# Patient Record
Sex: Male | Born: 1990
Health system: Southern US, Community
[De-identification: ages and names within clinical notes are randomized; demographics above are authoritative.]

## PROBLEM LIST (undated history)

## (undated) HISTORY — PX: APPENDECTOMY: SHX54

---

## 2020-05-07 ENCOUNTER — Encounter
Admission: EM | Disposition: A | Discharge status: Home / Self Care | Payer: Self-pay | Source: Home / Self Care | Attending: Emergency Medicine

## 2020-05-07 ENCOUNTER — Observation Stay: Payer: BC Managed Care – PPO | Admitting: Anesthesiology

## 2020-05-07 ENCOUNTER — Emergency Department: Payer: BC Managed Care – PPO

## 2020-05-07 ENCOUNTER — Other Ambulatory Visit: Payer: Self-pay

## 2020-05-07 ENCOUNTER — Observation Stay
Admission: EM | Admit: 2020-05-07 | Discharge: 2020-05-08 | Disposition: A | Discharge status: Home / Self Care | Payer: BC Managed Care – PPO | Attending: Surgery | Admitting: Surgery

## 2020-05-07 ENCOUNTER — Encounter: Payer: Self-pay | Admitting: Emergency Medicine

## 2020-05-07 DIAGNOSIS — K353 Acute appendicitis with localized peritonitis, without perforation or gangrene: Secondary | ICD-10-CM | POA: Diagnosis not present

## 2020-05-07 DIAGNOSIS — Z79899 Other long term (current) drug therapy: Secondary | ICD-10-CM | POA: Diagnosis not present

## 2020-05-07 DIAGNOSIS — K3580 Unspecified acute appendicitis: Principal | ICD-10-CM | POA: Insufficient documentation

## 2020-05-07 DIAGNOSIS — K37 Unspecified appendicitis: Secondary | ICD-10-CM | POA: Diagnosis not present

## 2020-05-07 DIAGNOSIS — Z20822 Contact with and (suspected) exposure to covid-19: Secondary | ICD-10-CM | POA: Insufficient documentation

## 2020-05-07 DIAGNOSIS — R1031 Right lower quadrant pain: Secondary | ICD-10-CM | POA: Diagnosis not present

## 2020-05-07 DIAGNOSIS — K6389 Other specified diseases of intestine: Secondary | ICD-10-CM | POA: Diagnosis not present

## 2020-05-07 HISTORY — PX: XI ROBOTIC LAPAROSCOPIC ASSISTED APPENDECTOMY: SHX6877

## 2020-05-07 LAB — URINALYSIS, COMPLETE (UACMP) WITH MICROSCOPIC
Bacteria, UA: NONE SEEN
Glucose, UA: NEGATIVE mg/dL
Ketones, ur: 5 mg/dL — AB
Leukocytes,Ua: NEGATIVE
Nitrite: NEGATIVE
Protein, ur: 100 mg/dL — AB
Specific Gravity, Urine: >1.046 — ABNORMAL HIGH (ref 1.005–1.030)
pH: 5 (ref 5.0–8.0)

## 2020-05-07 LAB — CBC
HCT: 41 % (ref 39.0–52.0)
Hemoglobin: 14.1 g/dL (ref 13.0–17.0)
MCH: 32.6 pg (ref 26.0–34.0)
MCHC: 34.4 g/dL (ref 30.0–36.0)
MCV: 94.7 fL (ref 80.0–100.0)
Platelets: 284 10*3/uL (ref 150–400)
RBC: 4.33 MIL/uL (ref 4.22–5.81)
RDW: 13.8 % (ref 11.5–15.5)
WBC: 18.8 10*3/uL — ABNORMAL HIGH (ref 4.0–10.5)
nRBC: 0 % (ref 0.0–0.2)

## 2020-05-07 LAB — COMPREHENSIVE METABOLIC PANEL
ALT: 12 U/L (ref 0–44)
AST: 16 U/L (ref 15–41)
Albumin: 4.7 g/dL (ref 3.5–5.0)
Alkaline Phosphatase: 50 U/L (ref 38–126)
Anion gap: 11 (ref 5–15)
BUN: 18 mg/dL (ref 6–20)
CO2: 26 mmol/L (ref 22–32)
Calcium: 9.3 mg/dL (ref 8.9–10.3)
Chloride: 104 mmol/L (ref 98–111)
Creatinine, Ser: 0.95 mg/dL (ref 0.61–1.24)
GFR calc Af Amer: >60 mL/min (ref >60)
GFR calc non Af Amer: >60 mL/min (ref >60)
Glucose, Bld: 129 mg/dL — ABNORMAL HIGH (ref 70–99)
Potassium: 3.3 mmol/L — ABNORMAL LOW (ref 3.5–5.1)
Sodium: 141 mmol/L (ref 135–145)
Total Bilirubin: 0.8 mg/dL (ref 0.3–1.2)
Total Protein: 7.4 g/dL (ref 6.5–8.1)

## 2020-05-07 LAB — SARS CORONAVIRUS 2 BY RT PCR (HOSPITAL ORDER, PERFORMED IN ~~LOC~~ HOSPITAL LAB): SARS Coronavirus 2: NEGATIVE

## 2020-05-07 SURGERY — APPENDECTOMY, ROBOT-ASSISTED, LAPAROSCOPIC
Anesthesia: General

## 2020-05-07 MED ORDER — KETOROLAC TROMETHAMINE 30 MG/ML IJ SOLN
INTRAMUSCULAR | Status: AC
  Filled 2020-05-07: qty 1

## 2020-05-07 MED ORDER — ONDANSETRON HCL 4 MG/2ML IJ SOLN
INTRAMUSCULAR | Status: AC
  Filled 2020-05-07: qty 2

## 2020-05-07 MED ORDER — PROPOFOL 10 MG/ML IV BOLUS
INTRAVENOUS | Status: DC | PRN
  Administered 2020-05-07: 200 mg via INTRAVENOUS

## 2020-05-07 MED ORDER — MIDAZOLAM HCL 2 MG/2ML IJ SOLN
INTRAMUSCULAR | Status: AC
  Filled 2020-05-07: qty 2

## 2020-05-07 MED ORDER — ROCURONIUM BROMIDE 100 MG/10ML IV SOLN
INTRAVENOUS | Status: DC | PRN
  Administered 2020-05-07: 50 mg via INTRAVENOUS

## 2020-05-07 MED ORDER — PHENYLEPHRINE HCL (PRESSORS) 10 MG/ML IV SOLN
INTRAVENOUS | Status: DC | PRN
  Administered 2020-05-07 (×2): 100 ug via INTRAVENOUS

## 2020-05-07 MED ORDER — METRONIDAZOLE IN NACL 5-0.79 MG/ML-% IV SOLN
500.0000 mg | Freq: Once | INTRAVENOUS | Status: AC
  Administered 2020-05-07: 500 mg via INTRAVENOUS
  Filled 2020-05-07: qty 100

## 2020-05-07 MED ORDER — KETOROLAC TROMETHAMINE 30 MG/ML IJ SOLN
INTRAMUSCULAR | Status: DC | PRN
  Administered 2020-05-07: 30 mg via INTRAVENOUS

## 2020-05-07 MED ORDER — PROPOFOL 10 MG/ML IV BOLUS
INTRAVENOUS | Status: AC
  Filled 2020-05-07: qty 20

## 2020-05-07 MED ORDER — MIDAZOLAM HCL 2 MG/2ML IJ SOLN
INTRAMUSCULAR | Status: DC | PRN
  Administered 2020-05-07 (×2): 2 mg via INTRAVENOUS

## 2020-05-07 MED ORDER — POTASSIUM CHLORIDE IN NACL 20-0.9 MEQ/L-% IV SOLN
INTRAVENOUS | Status: DC
  Filled 2020-05-07 (×4): qty 1000

## 2020-05-07 MED ORDER — SODIUM CHLORIDE 0.9 % IV SOLN
2.0000 g | Freq: Once | INTRAVENOUS | Status: AC
  Administered 2020-05-07: 2 g via INTRAVENOUS
  Filled 2020-05-07: qty 20

## 2020-05-07 MED ORDER — LIDOCAINE HCL (CARDIAC) PF 100 MG/5ML IV SOSY
PREFILLED_SYRINGE | INTRAVENOUS | Status: DC | PRN
  Administered 2020-05-07: 100 mg via INTRAVENOUS

## 2020-05-07 MED ORDER — OXYCODONE HCL 5 MG/5ML PO SOLN: 5.0000 mg | Freq: Once | ORAL | Status: DC | PRN

## 2020-05-07 MED ORDER — IBUPROFEN 800 MG PO TABS: 800.0000 mg | ORAL_TABLET | Freq: Three times a day (TID) | ORAL | 0 refills | Status: DC | PRN

## 2020-05-07 MED ORDER — ONDANSETRON HCL 4 MG/2ML IJ SOLN
INTRAMUSCULAR | Status: DC | PRN
  Administered 2020-05-07: 4 mg via INTRAVENOUS

## 2020-05-07 MED ORDER — SODIUM CHLORIDE 0.9 % IV SOLN: 2.0000 g | INTRAVENOUS | Status: DC

## 2020-05-07 MED ORDER — SODIUM CHLORIDE FLUSH 0.9 % IV SOLN
INTRAVENOUS | Status: AC
  Filled 2020-05-07: qty 10

## 2020-05-07 MED ORDER — FENTANYL CITRATE (PF) 250 MCG/5ML IJ SOLN
INTRAMUSCULAR | Status: AC
  Filled 2020-05-07: qty 5

## 2020-05-07 MED ORDER — HYDROCODONE-ACETAMINOPHEN 5-325 MG PO TABS
1.0000 | ORAL_TABLET | ORAL | Status: DC | PRN
  Administered 2020-05-07 – 2020-05-08 (×3): 2 via ORAL
  Filled 2020-05-07 (×3): qty 2

## 2020-05-07 MED ORDER — DEXAMETHASONE SODIUM PHOSPHATE 10 MG/ML IJ SOLN
INTRAMUSCULAR | Status: DC | PRN
  Administered 2020-05-07: 10 mg via INTRAVENOUS

## 2020-05-07 MED ORDER — PROMETHAZINE HCL 25 MG/ML IJ SOLN: 12.5000 mg | INTRAMUSCULAR | Status: DC | PRN

## 2020-05-07 MED ORDER — SODIUM CHLORIDE 0.9 % IV SOLN: INTRAVENOUS | Status: DC

## 2020-05-07 MED ORDER — FENTANYL CITRATE (PF) 100 MCG/2ML IJ SOLN: 25.0000 ug | INTRAMUSCULAR | Status: DC | PRN

## 2020-05-07 MED ORDER — PROMETHAZINE HCL 25 MG/ML IJ SOLN
INTRAMUSCULAR | Status: AC
  Administered 2020-05-07: 12.5 mg via INTRAVENOUS
  Filled 2020-05-07: qty 1

## 2020-05-07 MED ORDER — SODIUM CHLORIDE 0.9 % IV SOLN: Freq: Once | INTRAVENOUS | Status: DC

## 2020-05-07 MED ORDER — IOHEXOL 300 MG/ML  SOLN
100.0000 mL | Freq: Once | INTRAMUSCULAR | Status: AC | PRN
  Administered 2020-05-07: 100 mL via INTRAVENOUS

## 2020-05-07 MED ORDER — SODIUM CHLORIDE 0.9 % IV SOLN
2.0000 g | Freq: Once | INTRAVENOUS | Status: AC
  Administered 2020-05-08: 2 g via INTRAVENOUS
  Filled 2020-05-07: qty 2

## 2020-05-07 MED ORDER — FENTANYL CITRATE (PF) 100 MCG/2ML IJ SOLN
INTRAMUSCULAR | Status: DC | PRN
  Administered 2020-05-07 (×2): 100 ug via INTRAVENOUS
  Administered 2020-05-07: 50 ug via INTRAVENOUS

## 2020-05-07 MED ORDER — HYDROCODONE-ACETAMINOPHEN 5-325 MG PO TABS: 1.0000 | ORAL_TABLET | Freq: Four times a day (QID) | ORAL | 0 refills | Status: DC | PRN

## 2020-05-07 MED ORDER — BUPIVACAINE-EPINEPHRINE (PF) 0.25% -1:200000 IJ SOLN
INTRAMUSCULAR | Status: DC | PRN
  Administered 2020-05-07: 30 mL

## 2020-05-07 MED ORDER — SODIUM CHLORIDE 0.9 % IV SOLN
2.0000 g | Freq: Once | INTRAVENOUS | Status: DC
  Filled 2020-05-07: qty 20

## 2020-05-07 MED ORDER — GLYCOPYRROLATE 0.2 MG/ML IJ SOLN
INTRAMUSCULAR | Status: AC
  Filled 2020-05-07: qty 1

## 2020-05-07 MED ORDER — ONDANSETRON HCL 4 MG/2ML IJ SOLN: 4.0000 mg | Freq: Four times a day (QID) | INTRAMUSCULAR | Status: DC | PRN

## 2020-05-07 MED ORDER — DEXAMETHASONE SODIUM PHOSPHATE 10 MG/ML IJ SOLN
INTRAMUSCULAR | Status: AC
  Filled 2020-05-07: qty 1

## 2020-05-07 MED ORDER — MORPHINE SULFATE (PF) 4 MG/ML IV SOLN
4.0000 mg | Freq: Once | INTRAVENOUS | Status: AC
  Administered 2020-05-07: 4 mg via INTRAVENOUS
  Filled 2020-05-07: qty 1

## 2020-05-07 MED ORDER — ONDANSETRON 4 MG PO TBDP: 4.0000 mg | ORAL_TABLET | Freq: Four times a day (QID) | ORAL | Status: DC | PRN

## 2020-05-07 MED ORDER — METRONIDAZOLE IN NACL 5-0.79 MG/ML-% IV SOLN
500.0000 mg | Freq: Three times a day (TID) | INTRAVENOUS | Status: DC
  Administered 2020-05-07: 500 mg via INTRAVENOUS

## 2020-05-07 MED ORDER — MORPHINE SULFATE (PF) 2 MG/ML IV SOLN: 1.0000 mg | INTRAVENOUS | Status: DC | PRN

## 2020-05-07 MED ORDER — FENTANYL CITRATE (PF) 100 MCG/2ML IJ SOLN
INTRAMUSCULAR | Status: AC
  Filled 2020-05-07: qty 2

## 2020-05-07 MED ORDER — OXYCODONE HCL 5 MG PO TABS: 5.0000 mg | ORAL_TABLET | Freq: Once | ORAL | Status: DC | PRN

## 2020-05-07 MED ORDER — SUCCINYLCHOLINE CHLORIDE 20 MG/ML IJ SOLN
INTRAMUSCULAR | Status: DC | PRN
  Administered 2020-05-07: 50 mg via INTRAVENOUS

## 2020-05-07 MED ORDER — ONDANSETRON HCL 4 MG/2ML IJ SOLN
4.0000 mg | Freq: Once | INTRAMUSCULAR | Status: AC | PRN
  Administered 2020-05-07: 4 mg via INTRAVENOUS

## 2020-05-07 MED ORDER — ACETAMINOPHEN 500 MG PO TABS
1000.0000 mg | ORAL_TABLET | Freq: Four times a day (QID) | ORAL | Status: DC
  Administered 2020-05-07: 1000 mg via ORAL
  Filled 2020-05-07: qty 2

## 2020-05-07 MED ORDER — FENTANYL CITRATE (PF) 100 MCG/2ML IJ SOLN
INTRAMUSCULAR | Status: AC
Start: 2020-05-07 — End: 2020-05-07
  Administered 2020-05-07: 25 ug via INTRAVENOUS
  Filled 2020-05-07: qty 2

## 2020-05-07 MED ORDER — GLYCOPYRROLATE 0.2 MG/ML IJ SOLN
INTRAMUSCULAR | Status: DC | PRN
  Administered 2020-05-07: .1 mg via INTRAVENOUS

## 2020-05-07 MED ORDER — SUGAMMADEX SODIUM 200 MG/2ML IV SOLN
INTRAVENOUS | Status: DC | PRN
  Administered 2020-05-07: 200 mg via INTRAVENOUS

## 2020-05-07 MED ORDER — ONDANSETRON HCL 4 MG/2ML IJ SOLN
4.0000 mg | Freq: Once | INTRAMUSCULAR | Status: AC
  Administered 2020-05-07: 4 mg via INTRAVENOUS

## 2020-05-07 MED ORDER — LACTATED RINGERS IV SOLN: INTRAVENOUS | Status: DC | PRN

## 2020-05-07 SURGICAL SUPPLY — 57 items
ADH SKN CLS APL DERMABOND .7 (GAUZE/BANDAGES/DRESSINGS) ×1
APL PRP STRL LF DISP 70% ISPRP (MISCELLANEOUS) ×1
BAG SPEC RTRVL LRG 6X4 10 (ENDOMECHANICALS) ×1
BLADE CLIPPER SURG (BLADE) IMPLANT
CANISTER SUCT 1200ML W/VALVE (MISCELLANEOUS) ×3 IMPLANT
CHLORAPREP W/TINT 26 (MISCELLANEOUS) ×3 IMPLANT
CLIP VESOLOCK MED LG 6/CT (CLIP) IMPLANT
COVER TIP SHEARS 8 DVNC (MISCELLANEOUS) ×1 IMPLANT
COVER TIP SHEARS 8MM DA VINCI (MISCELLANEOUS) ×2
COVER WAND RF STERILE (DRAPES) ×3 IMPLANT
DECANTER SPIKE VIAL GLASS SM (MISCELLANEOUS) ×3 IMPLANT
DEFOGGER SCOPE WARMER CLEARIFY (MISCELLANEOUS) ×3 IMPLANT
DERMABOND ADVANCED (GAUZE/BANDAGES/DRESSINGS) ×2
DERMABOND ADVANCED .7 DNX12 (GAUZE/BANDAGES/DRESSINGS) ×1 IMPLANT
DRAPE ARM DVNC X/XI (DISPOSABLE) ×3 IMPLANT
DRAPE COLUMN DVNC XI (DISPOSABLE) ×1 IMPLANT
DRAPE DA VINCI XI ARM (DISPOSABLE) ×6
DRAPE DA VINCI XI COLUMN (DISPOSABLE) ×2
GLOVE ORTHO TXT STRL SZ7.5 (GLOVE) ×6 IMPLANT
GOWN STRL REUS W/ TWL LRG LVL3 (GOWN DISPOSABLE) ×4 IMPLANT
GOWN STRL REUS W/TWL LRG LVL3 (GOWN DISPOSABLE) ×12
GRASPER SUT TROCAR 14GX15 (MISCELLANEOUS) IMPLANT
INFUSOR MANOMETER BAG 3000ML (MISCELLANEOUS) IMPLANT
IRRIGATION STRYKERFLOW (MISCELLANEOUS) IMPLANT
IRRIGATOR STRYKERFLOW (MISCELLANEOUS)
IRRIGATOR SUCT 8 DISP DVNC XI (IRRIGATION / IRRIGATOR) IMPLANT
IRRIGATOR SUCTION 8MM XI DISP (IRRIGATION / IRRIGATOR)
IV NS IRRIG 3000ML ARTHROMATIC (IV SOLUTION) IMPLANT
KIT PINK PAD W/HEAD ARE REST (MISCELLANEOUS) ×3
KIT PINK PAD W/HEAD ARM REST (MISCELLANEOUS) ×1 IMPLANT
KIT TURNOVER KIT A (KITS) ×3 IMPLANT
LABEL OR SOLS (LABEL) ×3 IMPLANT
NEEDLE HYPO 22GX1.5 SAFETY (NEEDLE) ×3 IMPLANT
NEEDLE INSUFFLATION 14GA 120MM (NEEDLE) IMPLANT
NS IRRIG 500ML POUR BTL (IV SOLUTION) ×3 IMPLANT
PACK LAP CHOLECYSTECTOMY (MISCELLANEOUS) ×3 IMPLANT
POUCH SPECIMEN RETRIEVAL 10MM (ENDOMECHANICALS) ×3 IMPLANT
RELOAD STAPLER 3.5X45 BLU DVNC (STAPLE) IMPLANT
RELOAD STAPLER 3.5X60 BLU DVNC (STAPLE) IMPLANT
SEAL CANN UNIV 5-8 DVNC XI (MISCELLANEOUS) ×3 IMPLANT
SEAL XI 5MM-8MM UNIVERSAL (MISCELLANEOUS) ×6
SET TUBE SMOKE EVAC HIGH FLOW (TUBING) ×3 IMPLANT
SOLUTION ELECTROLUBE (MISCELLANEOUS) ×3 IMPLANT
STAPLER 45 DA VINCI SURE FORM (STAPLE)
STAPLER 45 SUREFORM DVNC (STAPLE) IMPLANT
STAPLER 60 DA VINCI SURE FORM (STAPLE)
STAPLER 60 SUREFORM DVNC (STAPLE) IMPLANT
STAPLER RELOAD 3.5X45 BLU DVNC (STAPLE)
STAPLER RELOAD 3.5X45 BLUE (STAPLE)
STAPLER RELOAD 3.5X60 BLU DVNC (STAPLE)
STAPLER RELOAD 3.5X60 BLUE (STAPLE)
SUT MNCRL 4-0 (SUTURE) ×3
SUT MNCRL 4-0 27XMFL (SUTURE) ×1
SUT VICRYL 0 AB UR-6 (SUTURE) ×3 IMPLANT
SUTURE MNCRL 4-0 27XMF (SUTURE) ×1 IMPLANT
TRAY FOLEY SLVR 16FR LF STAT (SET/KITS/TRAYS/PACK) ×3 IMPLANT
TROCAR Z-THREAD FIOS 11X100 BL (TROCAR) ×3 IMPLANT

## 2020-05-07 NOTE — ED Notes (Signed)
Pt trx to surgery

## 2020-05-07 NOTE — ED Provider Notes (Signed)
[facility] Emergency Department Provider Note  ____________________________________________   First MD Initiated Contact with Patient 05/07/20 0120     (approximate)  I have reviewed the triage vital signs and the nursing notes.   HISTORY  Chief Complaint Abdominal Pain    HPI Eric Valdez is a 29 y.o. male with no contributory past medical history including no history of kidney stones or abdominal surgeries.  He presents tonight for gradually worsening right-sided abdominal pain over the course of the day.  He said he felt normal yesterday when he went to bed but he awoke with pain in his right lower quadrant and right flank.  It was initially a dull achy pain but has become more sharp over the course of the day.  It is relatively constant and moving around makes it worse, nothing particular makes it better.  He developed nausea and at least one episode of vomiting over the last couple of hours, but earlier today he was able to eat and had no loss of appetite.  He denies fever/chills, sore throat, chest pain, shortness of breath, cough.  He has no dysuria or blood in his urine.         History reviewed. No pertinent past medical history.  There are no problems to display for this patient.   History reviewed. No pertinent surgical history.  Prior to Admission medications   Not on File    Allergies Penicillins  History reviewed. No pertinent family history.  Social History Social History   Tobacco Use  . Smoking status: Never Smoker  . Smokeless tobacco: Never Used  Substance Use Topics  . Alcohol use: Not on file  . Drug use: Not on file    Review of Systems Constitutional: No fever/chills Eyes: No visual changes. ENT: No sore throat. Cardiovascular: Denies chest pain. Respiratory: Denies shortness of breath. Gastrointestinal: Right-sided abdominal pain over the course of the day today, recently developed nausea and  vomiting. Genitourinary: Negative for dysuria.  Negative for hematuria. Musculoskeletal: Negative for neck pain.  Some pain in the right side possibly the right flank.  No back pain. Integumentary: Negative for rash. Neurological: Negative for headaches, focal weakness or numbness.   ____________________________________________   PHYSICAL EXAM:  VITAL SIGNS: ED Triage Vitals [05/07/20 0010]  Enc Vitals Group     BP 107/72     Pulse Rate 71     Resp 20     Temp 98.2 F (36.8 C)     Temp Source Oral     SpO2 100 %     Weight 86.2 kg (190 lb)     Height 1.778 m (5\' 10" )     Head Circumference      Peak Flow      Pain Score 6     Pain Loc      Pain Edu?      Excl. in GC?     Constitutional: Alert and oriented.  Eyes: Conjunctivae are normal.  Head: Atraumatic. Nose: No congestion/rhinnorhea. Mouth/Throat: Patient is wearing a mask. Neck: No stridor.  No meningeal signs.   Cardiovascular: Normal rate, regular rhythm. Good peripheral circulation. Grossly normal heart sounds. Respiratory: Normal respiratory effort.  No retractions. Gastrointestinal: Soft and nondistended.  Moderate to severe tenderness to palpation in the right lower quadrant with some rebound and guarding.  Also tender to the right upper quadrant including what I would interpret is a positive Murphy sign, but the right lower quadrant tenderness seems to be  more severe.  No tenderness on the left. Musculoskeletal: No lower extremity tenderness nor edema. No gross deformities of extremities. Neurologic:  Normal speech and language. No gross focal neurologic deficits are appreciated.  Skin:  Skin is pale, warm, dry and intact. Psychiatric: Mood and affect are normal. Speech and behavior are normal.  ____________________________________________   LABS (all labs ordered are listed, but only abnormal results are displayed)  Labs Reviewed  CBC - Abnormal; Notable for the following components:      Result Value    WBC 18.8 (*)    All other components within normal limits  COMPREHENSIVE METABOLIC PANEL - Abnormal; Notable for the following components:   Potassium 3.3 (*)    Glucose, Bld 129 (*)    All other components within normal limits  URINALYSIS, COMPLETE (UACMP) WITH MICROSCOPIC - Abnormal; Notable for the following components:   Color, Urine AMBER (*)    APPearance HAZY (*)    Specific Gravity, Urine >1.046 (*)    Hgb urine dipstick SMALL (*)    Bilirubin Urine SMALL (*)    Ketones, ur 5 (*)    Protein, ur 100 (*)    All other components within normal limits  SARS CORONAVIRUS 2 BY RT PCR (HOSPITAL ORDER, PERFORMED IN [place] [facility])   ____________________________________________  EKG  No indication for emergent EKG ____________________________________________  RADIOLOGY I, Loleta Rose, personally viewed and evaluated these images (plain radiographs) as part of my medical decision making, as well as reviewing the written report by the radiologist.  ED MD interpretation: Acute appendicitis with no evidence of perforation or abscess.  Official radiology report(s): No results found.  ____________________________________________   PROCEDURES   Procedure(s) performed (including Critical Care):  Procedures   ____________________________________________   INITIAL IMPRESSION / MDM / ASSESSMENT AND PLAN / ED COURSE  As part of my medical decision making, I reviewed the following data within the electronic MEDICAL RECORD NUMBER Nursing notes reviewed and incorporated, Labs reviewed , Old chart reviewed, Discussed with admitting physician (Dr. Claudine Mouton), A consult was requested and obtained from this/these consultant(s) Surgery and Notes from prior ED visits   Differential diagnosis includes, but is not limited to, appendicitis, renal/ureteral colic, biliary disease, viral illness, epiploic appendagitis.  Due to overwhelming patient volume in the ED and hospital, I  assessed the patient in triage after his lab results came back notable for a leukocytosis of about 19.  He also has both red and white blood cells in his urine.  I think that ureteral colic is very possible but we need to rule out appendicitis based on his white count and his physical exam.  CT of the abdomen and pelvis with IV contrast is pending.  Patient has received Zofran 4 mg IV and I have also ordered morphine 4 mg IV for his ongoing pain.  I will reassess after his imaging.  Patient understands and agrees with the current plan.     Clinical Course as of May 07 414  Thu May 07, 2020  0402 Acute appendicitis on CT scan.  Retrieved patient from the waiting room and brought him to room 9.  Unfortunately, he was eating Pop-Tarts in the waiting room (at about 3:45am).    I informed him of the diagnosis and stressed him to not eat or drink anything else.  His pain is well controlled at this time.   [CF]  7619 Paged Dr. Claudine Mouton with general surgery to discuss the patient for admission.   [CF]  3790 Discussed case by phone with Dr. Claudine Mouton.  He requested that I go ahead and give antibiotics and as per the ED antibiotics order set, I ordered ceftriaxone 2 g IV and metronidazole 500 mg IV.  I started the patient on normal saline infusion 100 mL/h.  COVID-19 test is pending.  Dr. Claudine Mouton will put in admission orders and see the patient later in the morning.   [CF]    Clinical Course User Index [CF] Loleta Rose, MD     ____________________________________________  FINAL CLINICAL IMPRESSION(S) / ED DIAGNOSES  Final diagnoses:  Acute appendicitis with localized peritonitis, without perforation, abscess, or gangrene     MEDICATIONS GIVEN DURING THIS VISIT:  Medications  morphine 4 MG/ML injection 4 mg (has no administration in time range)  0.9 %  sodium chloride infusion (has no administration in time range)  cefTRIAXone (ROCEPHIN) 2 g in sodium chloride 0.9 % 100 mL IVPB (has no  administration in time range)    And  metroNIDAZOLE (FLAGYL) IVPB 500 mg (has no administration in time range)  ondansetron (ZOFRAN) injection 4 mg (4 mg Intravenous Given 05/07/20 0112)  iohexol (OMNIPAQUE) 300 MG/ML solution 100 mL (100 mLs Intravenous Contrast Given 05/07/20 0201)     ED Discharge Orders    None      *Please note:  Haskell Rihn was evaluated in Emergency Department on 05/07/2020 for the symptoms described in the history of present illness. He was evaluated in the context of the global COVID-19 pandemic, which necessitated consideration that the patient might be at risk for infection with the SARS-CoV-2 virus that causes COVID-19. Institutional protocols and algorithms that pertain to the evaluation of patients at risk for COVID-19 are in a state of rapid change based on information released by regulatory bodies including the CDC and federal and state organizations. These policies and algorithms were followed during the patient's care in the ED.  Some ED evaluations and interventions may be delayed as a result of limited staffing during and after the pandemic.*  Note:  This document was prepared using Dragon voice recognition software and may include unintentional dictation errors.   Loleta Rose, MD 05/07/20 [id number]

## 2020-05-07 NOTE — Discharge Instructions (Signed)

## 2020-05-07 NOTE — ED Triage Notes (Signed)
Pt in with co lower abd pain that started this am. Worse to RLQ and right flank area. Hx of the same but never had a work up. Pt did vomit x 1, no diarrhea. Denies any fever, or dysuria.

## 2020-05-07 NOTE — ED Notes (Addendum)
Pt noted in lobby eating snacks with no distress noted

## 2020-05-07 NOTE — Anesthesia Procedure Notes (Signed)
Procedure Name: Intubation Date/Time: 05/07/2020 12:56 PM Performed by: Armanda Heritage, CRNA Pre-anesthesia Checklist: Patient identified, Timeout performed, Emergency Drugs available, Suction available and Patient being monitored Patient Re-evaluated:Patient Re-evaluated prior to induction Oxygen Delivery Method: Circle system utilized Preoxygenation: Pre-oxygenation with 100% oxygen Induction Type: Rapid sequence Laryngoscope Size: McGraph and 4 Grade View: Grade I Tube size: 7.5 mm Number of attempts: 1 Airway Equipment and Method: Stylet Placement Confirmation: ETT inserted through vocal cords under direct vision,  positive ETCO2 and breath sounds checked- equal and bilateral Secured at: 22 cm Dental Injury: Teeth and Oropharynx as per pre-operative assessment

## 2020-05-07 NOTE — Op Note (Signed)
Robotic appendectomy  Pre-operative Diagnosis: Acute appendicitis  Post-operative Diagnosis: same.    Surgeon: Campbell Lerner, M.D., FACS  Anesthesia: General  Findings: As expected enlarged, indurated appendix with local reaction.  No evidence of gangrene, or perforation  Estimated Blood Loss: 5 mL         Specimens: Appendix          Complications: none              Procedure Details  The patient was seen again in the Holding Room. The benefits, complications, treatment options, and expected outcomes were discussed with the patient. The risks of bleeding, infection, recurrence of symptoms, failure to resolve symptoms, unanticipated injury, prosthetic placement, prosthetic infection, any of which could require further surgery were reviewed with the patient. The likelihood of improving the patient's symptoms with return to their baseline status is good.  The patient and/or family concurred with the proposed plan, giving informed consent.  The patient was taken to Operating Room, identified and the procedure verified.    Prior to the induction of general anesthesia, antibiotic prophylaxis was administered. VTE prophylaxis was in place.  General anesthesia was then administered and tolerated well. After the induction, the patient was positioned in the supine position and the abdomen was prepped with Chloraprep and draped in the sterile fashion.  A Time Out was held and the above information confirmed.  After local infiltration of quarter percent Marcaine with epinephrine, stab incision was made left upper quadrant.  Just below the costal margin approximately midclavicular line the Veress needle is passed with sensation of the layers to penetrate the abdominal wall and into the peritoneum.  Saline drop test is confirmed peritoneal placement.  Insufflation is initiated with carbon dioxide to pressures of 15 mmHg.  With local anesthetic infiltration, a left lower quadrant incision is made, and  a robotic 8.5 mm trocar is passed into the peritoneal cavity.  An additional 8.5 mm robotic trochars placed in the suprapubic area and in the left abdominal wall under direct visualization. Using a force bipolar grasper and monopolar scissors proceeded with dissecting out the soft tissues adjacent to the cecum and appendix to fully identify the appendix, and mobilize it. The mesoappendix was carefully divided utilizing bipolar cautery, monopolar cautery and scissors. With the appendiceal cecal junction fully isolated, the appendiceal base was crimped, a double ligature of 2-0 Vicryl was utilized to ligate the base of the appendix, and the appendix was divided with monopolar scissors, the appendiceal mucosa was then fulgurated with the same. I utilized a 2-0 Vicryl to dunk the appendiceal stump into the cecal serosa, burying the stump with a pursestring suture. We then undocked the robot and proceeded with completing the procedure laparoscopically.  We then placed the appendix in a retrieval bag and withdrew it out the suprapubic port site. We closed the suprapubic port site utilizing a 0 Vicryl under direct visualization.  The minimal surgical fluid was aspirated from the right lower quadrant. The abdomen was then desufflated and the trochars removed. Incisions were then irrigated and closed with subcuticulars of 4-0 Monocryl.  Skin sealed with Dermabond.  Patient tolerated procedure well.    Campbell Lerner M.D., [facility] 05/07/2020 2:17 PM

## 2020-05-07 NOTE — Anesthesia Preprocedure Evaluation (Signed)
Anesthesia Evaluation  Patient identified by MRN, date of birth, ID band Patient awake    Reviewed: Allergy & Precautions, NPO status , Patient's Chart, lab work & pertinent test results  History of Anesthesia Complications Negative for: history of anesthetic complications  Airway Mallampati: II  TM Distance: >3 FB Neck ROM: Full    Dental  (+) Poor Dentition, Missing, Dental Advisory Given   Pulmonary neg pulmonary ROS, neg sleep apnea, neg COPD, Patient abstained from smoking.Not current smoker,    Pulmonary exam normal breath sounds clear to auscultation       Cardiovascular Exercise Tolerance: Good METS(-) hypertension(-) CAD and (-) Past MI negative cardio ROS  (-) dysrhythmias  Rhythm:Regular Rate:Normal - Systolic murmurs    Neuro/Psych negative neurological ROS  negative psych ROS   GI/Hepatic neg GERD  ,(+)     (-) substance abuse  ,   Endo/Other  neg diabetes  Renal/GU negative Renal ROS     Musculoskeletal   Abdominal   Peds  Hematology   Anesthesia Other Findings History reviewed. No pertinent past medical history.  Reproductive/Obstetrics                             Anesthesia Physical Anesthesia Plan  ASA: II  Anesthesia Plan: General   Post-op Pain Management:    Induction: Intravenous and Rapid sequence  PONV Risk Score and Plan: 3 and Ondansetron, Dexamethasone and Midazolam  Airway Management Planned: Oral ETT  Additional Equipment: None  Intra-op Plan:   Post-operative Plan: Extubation in OR  Informed Consent: I have reviewed the patients History and Physical, chart, labs and discussed the procedure including the risks, benefits and alternatives for the proposed anesthesia with the patient or authorized representative who has indicated his/her understanding and acceptance.     Dental advisory given  Plan Discussed with: CRNA and  Surgeon  Anesthesia Plan Comments: (Patient has vomited twice in past two days but denies any today and feels well from nausea standpoint. Discussed risks of anesthesia with patient, including PONV, sore throat, lip/dental damage. Rare risks discussed as well, such as cardiorespiratory and neurological sequelae. Patient understands.)        Anesthesia Quick Evaluation

## 2020-05-07 NOTE — H&P (Signed)
[facility] SURGICAL HISTORY & PHYSICAL (cpt [id number])  HISTORY OF PRESENT ILLNESS (HPI):  29 y.o. male presented to [facility] ED today for abdominal pain. Patient reports a 24 hour history of gradually worsening right sided abdominal pain primarily in his right lower quadrant. At first, he believed this was related to a muscle strain however the pain persisted throughout the day and he subsequently developed nausea and emesis. Nothing seemed to provide any relief. No associated fever, chills, cough, congestion, CP, SOB, urinary changes, or bowel changes. No previous abdominal surgeries. Work up in the ED was concerning for leukocytosis to 18K, mild hypokalemia to 3.3, and CT Abdomen/pelvis was concerning for acute uncomplicated appendicitis.   General surgrery is consulted by emergency medicine physician Dr Loleta Rose, MD for evaluation and management of acute uncomplicated appendicitis.   PAST MEDICAL HISTORY (PMH):  History reviewed. No pertinent past medical history.  Reviewed. Otherwise negative.   PAST SURGICAL HISTORY (PSH):  History reviewed. No pertinent surgical history.  Reviewed. Otherwise negative.   MEDICATIONS:  Prior to Admission medications   Not on File     ALLERGIES:  Allergies  Allergen Reactions  . Penicillins Other (See Comments)    Told by father     SOCIAL HISTORY:  Social History   Socioeconomic History  . Marital status: Single    Spouse name: Not on file  . Number of children: Not on file  . Years of education: Not on file  . Highest education level: Not on file  Occupational History  . Not on file  Tobacco Use  . Smoking status: Never Smoker  . Smokeless tobacco: Never Used  Substance and Sexual Activity  . Alcohol use: Not on file  . Drug use: Not on file  . Sexual activity: Not on file  Other Topics Concern  . Not on file  Social History Narrative  . Not on file   Social Determinants of Health   Financial Resource Strain:    . Difficulty of Paying Living Expenses: Not on file  Food Insecurity:   . Worried About Programme researcher, broadcasting/film/video in the Last Year: Not on file  . Ran Out of Food in the Last Year: Not on file  Transportation Needs:   . Lack of Transportation (Medical): Not on file  . Lack of Transportation (Non-Medical): Not on file  Physical Activity:   . Days of Exercise per Week: Not on file  . Minutes of Exercise per Session: Not on file  Stress:   . Feeling of Stress : Not on file  Social Connections:   . Frequency of Communication with Friends and Family: Not on file  . Frequency of Social Gatherings with Friends and Family: Not on file  . Attends Religious Services: Not on file  . Active Member of Clubs or Organizations: Not on file  . Attends Banker Meetings: Not on file  . Marital Status: Not on file  Intimate Partner Violence:   . Fear of Current or Ex-Partner: Not on file  . Emotionally Abused: Not on file  . Physically Abused: Not on file  . Sexually Abused: Not on file     FAMILY HISTORY:  History reviewed. No pertinent family history.  Otherwise negative.   REVIEW OF SYSTEMS:  Review of Systems  Constitutional: Negative for chills and fever.  HENT: Negative for congestion and sore throat.   Respiratory: Negative for cough and shortness of breath.   Cardiovascular: Negative for chest pain and palpitations.  Gastrointestinal: Positive for abdominal pain, nausea and vomiting. Negative for constipation and diarrhea.  Genitourinary: Negative for dysuria and urgency.  All other systems reviewed and are negative.   VITAL SIGNS:  Temp:  [98.2 F (36.8 C)] 98.2 F (36.8 C) (08/26 0010) Pulse Rate:  [71-83] 83 (08/26 0500) Resp:  [20] 20 (08/26 0010) BP: (102-107)/(69-72) 102/69 (08/26 0500) SpO2:  [96 %-100 %] 96 % (08/26 0500) Weight:  [86.2 kg] 86.2 kg (08/26 0010)     Height: 5\' 10"  (177.8 cm) Weight: 86.2 kg BMI (Calculated): 27.26   PHYSICAL EXAM:  Physical  Exam Vitals and nursing note reviewed. Exam conducted with a chaperone present.  Constitutional:      General: He is not in acute distress.    Appearance: He is well-developed. He is not ill-appearing.  HENT:     Head: Normocephalic and atraumatic.  Eyes:     General: No scleral icterus.    Extraocular Movements: Extraocular movements intact.  Cardiovascular:     Rate and Rhythm: Normal rate and regular rhythm.     Heart sounds: Normal heart sounds. No murmur heard.  No friction rub. No gallop.   Pulmonary:     Effort: Pulmonary effort is normal. No respiratory distress.     Breath sounds: Normal breath sounds.  Abdominal:     General: There is no distension.     Palpations: Abdomen is soft.     Tenderness: There is abdominal tenderness in the right lower quadrant. There is no guarding or rebound. Positive signs include McBurney's sign. Negative signs include Murphy's sign.  Genitourinary:    Comments: Deferred Skin:    General: Skin is warm and dry.     Coloration: Skin is not jaundiced or pale.  Neurological:     General: No focal deficit present.     Mental Status: He is alert and oriented to person, place, and time.  Psychiatric:        Mood and Affect: Mood normal.        Behavior: Behavior normal.     INTAKE/OUTPUT:  This shift: Total I/O In: 100.2 [IV Piggyback:100.2] Out: -   Last 2 shifts: @IOLAST2SHIFTS @  Labs:  CBC Latest Ref Rng & Units 05/07/2020  WBC 4.0 - 10.5 K/uL 18.8(H)  Hemoglobin 13.0 - 17.0 g/dL  Hematocrit 39 - 52 % 41.0  Platelets 150 - 400 K/uL 284   CMP Latest Ref Rng & Units 05/07/2020  Glucose 70 - 99 mg/dL 43.1)  BUN 6 - 20 mg/dL 18  Creatinine 05/09/2020 - 540(G mg/dL 8.67  Sodium 6.19 - 5.09 mmol/L 141  Potassium 3.5 - 5.1 mmol/L 3.3(L)  Chloride 98 - 111 mmol/L 104  CO2 22 - 32 mmol/L 26  Calcium 8.9 - 10.3 mg/dL 9.3  Total Protein 6.5 - 8.1 g/dL 7.4  Total Bilirubin 0.3 - 1.2 mg/dL 0.8  Alkaline Phos 38 - 126 U/L 50  AST 15 - 41  U/L 16  ALT 0 - 44 U/L 12     Imaging studies:   CT Abdomen/Pelvis (05/07/2020) personally reviewed with dilated and inflamed appendix with periappendiceal stranding without abscess or perforation, and radiologist report reviewed:  IMPRESSION: Findings consistent with acute appendicitis. No evidence for perforation or other complication.   Assessment/Plan: (ICD-10's: K35.80) 29 y.o. male with right lower quadrant abdominal pain and leukocytosis concerning for acute uncomplicated appendicitis without perforation or abscess.    - Admit to general surgery  - Plan for laparoscopic appendectomy this afternoon (patient  ate at ~0300) pending OR/Anesthesia availability  - All risks, benefits, and alternatives to above procedure(s) were discussed with the patient, all of his questions were answered to his expressed satisfaction, patient expresses he wishes to proceed, and informed consent was obtained.  - NPO + IVF Resuscitation w/ KCL  - Monitor abdominal examination  - pain control prn; anti-emetics prn  - IV ABx (Ceftriaxone + Flagyl)  - Mobilization   - DVT prophylaxis; hold for OR  All of the above findings and recommendations were discussed with the patient, and all of his questions were answered to his expressed satisfaction.  -- Lynden Oxford, PA-C [facility] 05/07/2020, 6:55 AM [phone] M-F: 7am - 4pm

## 2020-05-07 NOTE — Transfer of Care (Signed)
Immediate Anesthesia Transfer of Care Note  Patient: Eric Valdez  Procedure(s) Performed: XI ROBOTIC LAPAROSCOPIC ASSISTED APPENDECTOMY (N/A )  Patient Location: PACU  Anesthesia Type:General  Level of Consciousness: awake, alert  and oriented  Airway & Oxygen Therapy: Patient Spontanous Breathing and Patient connected to nasal cannula oxygen  Post-op Assessment: Report given to RN and Post -op Vital signs reviewed and stable  Post vital signs: Reviewed and stable  Last Vitals:  Vitals Value Taken Time  BP 127/76 05/07/20 1421  Temp    Pulse 81 05/07/20 1425  Resp 15 05/07/20 1425  SpO2 100 % 05/07/20 1425  Vitals shown include unvalidated device data.  Last Pain:  Vitals:   05/07/20 1200  TempSrc:   PainSc: 0-No pain         Complications: No complications documented.

## 2020-05-08 ENCOUNTER — Encounter: Payer: Self-pay | Admitting: Surgery

## 2020-05-08 NOTE — Discharge Summary (Signed)
[facility] SURGICAL DISCHARGE SUMMARY  Patient ID: Eric Valdez MRN: [id number] DOB/AGE: 11/30/1990 29 y.o.  Admit date: 05/07/2020 Discharge date: 05/08/2020  Discharge Diagnoses Patient Active Problem List   Diagnosis Date Noted  . Appendicitis 05/07/2020    Consultants None  Procedures 05/07/2020:  Robotic assisted laparoscopic appendectomy  HPI: 29 y.o. male presented to [facility] ED today for abdominal pain. Patient reports a 24 hour history of gradually worsening right sided abdominal pain primarily in his right lower quadrant. At first, he believed this was related to a muscle strain however the pain persisted throughout the day and he subsequently developed nausea and emesis. Nothing seemed to provide any relief. No associated fever, chills, cough, congestion, CP, SOB, urinary changes, or bowel changes. No previous abdominal surgeries. Work up in the ED was concerning for leukocytosis to 18K, mild hypokalemia to 3.3, and CT Abdomen/pelvis was concerning for acute uncomplicated appendicitis.  Hospital Course: Informed consent was obtained and documented, and patient underwent uneventful robotic assisted laparoscopic appendectomy (Dr Claudine Mouton, 05/07/2020).  Post-operatively, patient had issue with N/V post-op and required admission. On POD1 this resolved and advancement of patient's diet and ambulation were well-tolerated. The remainder of patient's hospital course was essentially unremarkable, and discharge planning was initiated accordingly with patient safely able to be discharged home with appropriate discharge instructions, pain control, and outpatient follow-up after all of his questions were answered to his expressed satisfaction.   Discharge Condition: Good   Physical Examination:  Constitutional: Well appearing male, NAD Pulmonary: Normal effort, no respiratory distress Gastrointestinal: Soft, non-tender, non-distended, no rebound/guarding Skin: Laparoscopic  incisions are CDI with dermabond, no erythema or drainage, there is some ecchymosis    Allergies as of 05/08/2020      Reactions   Penicillins Other (See Comments)   Told by father      Medication List    TAKE these medications   HYDROcodone-acetaminophen 5-325 MG tablet Commonly known as: NORCO/VICODIN Take 1 tablet by mouth every 6 (six) hours as needed for moderate pain.   ibuprofen 800 MG tablet Commonly known as: ADVIL Take 1 tablet (800 mg total) by mouth every 8 (eight) hours as needed.            Discharge Care Instructions  (From admission, onward)         Start     Ordered   05/07/20 0000  Discharge wound care:       Comments: Your incision was closed with Dermabond.  It is best to keep it clean and dry, it will tolerate a brief shower, but do not soak it or apply any creams or lotions to the incisions.  The Dermabond should gradually flake off over time.  Keep it open to air so you can evaluate your incisions.  Dermabond assists the underlying sutures to keep your incision closed and protected from infection.  Should you develop some drainage from your incision, some drops of drainage would be okay but if it persists continue to put keep a dry dressing over it.   05/07/20 1419            Follow-up Information    Campbell Lerner, MD Follow up on 05/21/2020.   Specialty: General Surgery Why: @ 9:45 am for post op visit Contact information: [address] [place] [address] [phone]                Time spent on discharge management including discussion of hospital course, clinical condition, outpatient instructions,  prescriptions, and follow up with the patient and members of the medical team: >30 minutes  -- Lynden Oxford , PA-C [facility]  05/08/2020, 9:15 AM [phone] M-F: 7am - 4pm

## 2020-05-08 NOTE — Anesthesia Postprocedure Evaluation (Signed)
Anesthesia Post Note  Patient: Eric Valdez  Procedure(s) Performed: XI ROBOTIC LAPAROSCOPIC ASSISTED APPENDECTOMY (N/A )  Patient location during evaluation: PACU Anesthesia Type: General Level of consciousness: awake and alert Pain management: pain level controlled Vital Signs Assessment: post-procedure vital signs reviewed and stable Respiratory status: spontaneous breathing, nonlabored ventilation, respiratory function stable and patient connected to nasal cannula oxygen Cardiovascular status: blood pressure returned to baseline and stable Postop Assessment: no apparent nausea or vomiting Anesthetic complications: no   No complications documented.   Last Vitals:  Vitals:   05/08/20 0550 05/08/20 0908  BP: (!) 93/57 94/66  Pulse: 66 62  Resp: 20 15  Temp: 36.7 C 36.8 C  SpO2: 96% 99%    Last Pain:  Vitals:   05/08/20 0908  TempSrc: Oral  PainSc:                  Corinda Gubler

## 2020-05-08 NOTE — Progress Notes (Signed)
Pt discharged per MD order. IV removed. Discharge instructions reviewed with pt. Pt verbalized understanding of discharge instructions with all questions answered to pt satisfaction. Pt advised to have someone else drive him home but pt reported that his car was here and he was unable to provide someone to drive him. He says he is able to afford an [employer] but does not wish to pay for a ride to his home and then another to bring him back here to get his car. Pt has not received pain medication since 0550 this morning. Laqueta Due, PA aware of pts plan to drive home despite his recommendation. Pt alert and orientated x4. Pt wheeled by a volunteer to the ED where pt had parked his car

## 2020-05-11 ENCOUNTER — Emergency Department
Admission: EM | Admit: 2020-05-11 | Discharge: 2020-05-11 | Disposition: A | Discharge status: Home / Self Care | Payer: BC Managed Care – PPO | Attending: Emergency Medicine | Admitting: Emergency Medicine

## 2020-05-11 ENCOUNTER — Other Ambulatory Visit: Payer: Self-pay

## 2020-05-11 ENCOUNTER — Emergency Department: Payer: BC Managed Care – PPO

## 2020-05-11 ENCOUNTER — Encounter: Payer: Self-pay | Admitting: Emergency Medicine

## 2020-05-11 DIAGNOSIS — R1032 Left lower quadrant pain: Secondary | ICD-10-CM | POA: Diagnosis not present

## 2020-05-11 DIAGNOSIS — G8918 Other acute postprocedural pain: Secondary | ICD-10-CM

## 2020-05-11 DIAGNOSIS — R52 Pain, unspecified: Secondary | ICD-10-CM | POA: Diagnosis not present

## 2020-05-11 DIAGNOSIS — R1030 Lower abdominal pain, unspecified: Secondary | ICD-10-CM | POA: Diagnosis not present

## 2020-05-11 DIAGNOSIS — R1084 Generalized abdominal pain: Secondary | ICD-10-CM | POA: Diagnosis not present

## 2020-05-11 DIAGNOSIS — R109 Unspecified abdominal pain: Secondary | ICD-10-CM | POA: Diagnosis not present

## 2020-05-11 DIAGNOSIS — Z9049 Acquired absence of other specified parts of digestive tract: Secondary | ICD-10-CM | POA: Diagnosis not present

## 2020-05-11 LAB — COMPREHENSIVE METABOLIC PANEL
ALT: 13 U/L (ref 0–44)
AST: 16 U/L (ref 15–41)
Albumin: 4 g/dL (ref 3.5–5.0)
Alkaline Phosphatase: 32 U/L — ABNORMAL LOW (ref 38–126)
Anion gap: 12 (ref 5–15)
BUN: 10 mg/dL (ref 6–20)
CO2: 24 mmol/L (ref 22–32)
Calcium: 8.6 mg/dL — ABNORMAL LOW (ref 8.9–10.3)
Chloride: 102 mmol/L (ref 98–111)
Creatinine, Ser: 0.9 mg/dL (ref 0.61–1.24)
GFR calc Af Amer: >60 mL/min (ref >60)
GFR calc non Af Amer: >60 mL/min (ref >60)
Glucose, Bld: 97 mg/dL (ref 70–99)
Potassium: 3 mmol/L — ABNORMAL LOW (ref 3.5–5.1)
Sodium: 138 mmol/L (ref 135–145)
Total Bilirubin: 1.2 mg/dL (ref 0.3–1.2)
Total Protein: 6.6 g/dL (ref 6.5–8.1)

## 2020-05-11 LAB — CBC
HCT: 35.4 % — ABNORMAL LOW (ref 39.0–52.0)
Hemoglobin: 12.3 g/dL — ABNORMAL LOW (ref 13.0–17.0)
MCH: 32.7 pg (ref 26.0–34.0)
MCHC: 34.7 g/dL (ref 30.0–36.0)
MCV: 94.1 fL (ref 80.0–100.0)
Platelets: 248 10*3/uL (ref 150–400)
RBC: 3.76 MIL/uL — ABNORMAL LOW (ref 4.22–5.81)
RDW: 13.3 % (ref 11.5–15.5)
WBC: 6.6 10*3/uL (ref 4.0–10.5)
nRBC: 0 % (ref 0.0–0.2)

## 2020-05-11 LAB — SURGICAL PATHOLOGY

## 2020-05-11 LAB — LIPASE, BLOOD: Lipase: 22 U/L (ref 11–51)

## 2020-05-11 MED ORDER — HYDROMORPHONE HCL 1 MG/ML IJ SOLN
1.0000 mg | Freq: Once | INTRAMUSCULAR | Status: AC
  Administered 2020-05-11: 1 mg via INTRAVENOUS
  Filled 2020-05-11: qty 1

## 2020-05-11 MED ORDER — IOHEXOL 300 MG/ML  SOLN
100.0000 mL | Freq: Once | INTRAMUSCULAR | Status: AC | PRN
  Administered 2020-05-11: 100 mL via INTRAVENOUS

## 2020-05-11 MED ORDER — ONDANSETRON 4 MG PO TBDP: 4.0000 mg | ORAL_TABLET | Freq: Three times a day (TID) | ORAL | 0 refills | Status: DC | PRN

## 2020-05-11 MED ORDER — SODIUM CHLORIDE 0.9 % IV BOLUS
1000.0000 mL | Freq: Once | INTRAVENOUS | Status: AC
  Administered 2020-05-11: 1000 mL via INTRAVENOUS

## 2020-05-11 MED ORDER — ONDANSETRON HCL 4 MG/2ML IJ SOLN
4.0000 mg | Freq: Once | INTRAMUSCULAR | Status: AC
  Administered 2020-05-11: 4 mg via INTRAVENOUS
  Filled 2020-05-11: qty 2

## 2020-05-11 NOTE — Discharge Instructions (Signed)
Your labs and CT scan today were all reassuring.  Continue taking your pain medications and follow-up with your surgeon if symptoms do not improve over the next few days.

## 2020-05-11 NOTE — ED Notes (Signed)
Pt presents to the ED. Pt had an laparoscopic appendectomy on Thursday. Pt states this morning pt had stabbing L groin pain and was unable to move the pain was so bad. Pt also had a episode of nausea without vomiting this am. Pt states the pain is a little better since then but still is painful. Pt A&Ox4 and NAD at this time.

## 2020-05-11 NOTE — ED Provider Notes (Signed)
[facility] Emergency Department Provider Note  ____________________________________________  Time seen: Approximately 2:33 PM  I have reviewed the triage vital signs and the nursing notes.   HISTORY  Chief Complaint Abdominal Pain    HPI Eric Valdez is a 29 y.o. male who recently had a laparoscopic appendectomy 4 days ago who reports persistent abdominal pain since then which suddenly worsened this morning, particularly in the area of the left groin, worse with movement associate with an episode of nausea and near syncope.  Nonradiating.  No alleviating factors, currently 10/10.  Denies fevers or chills.      History reviewed. No pertinent past medical history.   Patient Active Problem List   Diagnosis Date Noted  . Appendicitis 05/07/2020     Past Surgical History:  Procedure Laterality Date  . XI ROBOTIC LAPAROSCOPIC ASSISTED APPENDECTOMY N/A 05/07/2020   Procedure: XI ROBOTIC LAPAROSCOPIC ASSISTED APPENDECTOMY;  Surgeon: Campbell Lerner, MD;  Location: [facility] ORS;  Service: General;  Laterality: N/A;     Prior to Admission medications   Medication Sig Start Date End Date Taking? Authorizing Provider  HYDROcodone-acetaminophen (NORCO/VICODIN) 5-325 MG tablet Take 1 tablet by mouth every 6 (six) hours as needed for moderate pain. 05/07/20   Campbell Lerner, MD  ibuprofen (ADVIL) 800 MG tablet Take 1 tablet (800 mg total) by mouth every 8 (eight) hours as needed. 05/07/20   Campbell Lerner, MD     Allergies Penicillins   No family history on file.  Social History Social History   Tobacco Use  . Smoking status: Never Smoker  . Smokeless tobacco: Never Used  Substance Use Topics  . Alcohol use: Not Currently  . Drug use: Not Currently    Review of Systems  Constitutional:   No fever or chills.  ENT:   No sore throat. No rhinorrhea. Cardiovascular:   No chest pain or syncope. Respiratory:   No dyspnea or cough. Gastrointestinal:    Positive abdominal pain as above without vomiting and diarrhea.  Musculoskeletal:   Negative for focal pain or swelling All other systems reviewed and are negative except as documented above in ROS and HPI.  ____________________________________________   PHYSICAL EXAM:  VITAL SIGNS: ED Triage Vitals [05/11/20 0650]  Enc Vitals Group     BP 110/70     Pulse Rate 64     Resp 18     Temp 97.7 F (36.5 C)     Temp Source Oral     SpO2 100 %     Weight 190 lb (86.2 kg)     Height 5\' 11"  (1.803 m)     Head Circumference      Peak Flow      Pain Score 10     Pain Loc      Pain Edu?      Excl. in GC?     Vital signs reviewed, nursing assessments reviewed.   Constitutional:   Alert and oriented. Non-toxic appearance. Eyes:   Conjunctivae are normal. EOMI. PERRL. ENT      Head:   Normocephalic and atraumatic.      Nose:   Wearing a mask.      Mouth/Throat:   Wearing a mask.      Neck:   No meningismus. Full ROM. Hematological/Lymphatic/Immunilogical:   No cervical lymphadenopathy. Cardiovascular:   RRR. Symmetric bilateral radial and DP pulses.  No murmurs. Cap refill less than 2 seconds. Respiratory:   Normal respiratory effort without tachypnea/retractions. Breath sounds are clear and  equal bilaterally. No wheezes/rales/rhonchi. Gastrointestinal:   Soft with diffuse tenderness of the lower abdomen with guarding.  4 laparoscopic incision sites are all healing well and not inflamed.  Musculoskeletal:   Normal range of motion in all extremities. No joint effusions.  No lower extremity tenderness.  No edema. Neurologic:   Normal speech and language.  Motor grossly intact. No acute focal neurologic deficits are appreciated.  Skin:    Skin is warm, dry and intact. No rash noted.  No petechiae, purpura, or bullae.  ____________________________________________    LABS (pertinent positives/negatives) (all labs ordered are listed, but only abnormal results are displayed) Labs  Reviewed  COMPREHENSIVE METABOLIC PANEL - Abnormal; Notable for the following components:      Result Value   Potassium 3.0 (*)    Calcium 8.6 (*)    Alkaline Phosphatase 32 (*)    All other components within normal limits  CBC - Abnormal; Notable for the following components:   RBC 3.76 (*)    Hemoglobin 12.3 (*)    HCT 35.4 (*)    All other components within normal limits  LIPASE, BLOOD  URINALYSIS, COMPLETE (UACMP) WITH MICROSCOPIC   ____________________________________________   EKG    ____________________________________________    RADIOLOGY  CT ABDOMEN PELVIS W CONTRAST  Result Date: 05/11/2020 CLINICAL DATA:  Worsening lower abdominal pain. Five days postop from appendectomy. EXAM: CT ABDOMEN AND PELVIS WITH CONTRAST TECHNIQUE: Multidetector CT imaging of the abdomen and pelvis was performed using the standard protocol following bolus administration of intravenous contrast. CONTRAST:  OMNIPAQUE IOHEXOL 300 MG/ML  SOLN COMPARISON:  05/07/2020 FINDINGS: Lower Chest: No acute findings. Hepatobiliary: No hepatic masses identified. Gallbladder is unremarkable. No evidence of biliary ductal dilatation. Pancreas:  No mass or inflammatory changes. Spleen: Within normal limits in size and appearance. Adrenals/Urinary Tract: No masses identified. No evidence of ureteral calculi or hydronephrosis. Stomach/Bowel: Postop changes are seen from recent appendectomy. Soft tissue stranding in right lower quadrant has decreased since previous study. No evidence of abscess or dilated bowel loops. Vascular/Lymphatic: No pathologically enlarged lymph nodes. No abdominal aortic aneurysm. Reproductive:  No mass or other significant abnormality. Other:  None. Musculoskeletal:  No suspicious bone lesions identified. IMPRESSION: Postop changes from recent appendectomy. No evidence of abscess or other complication. Electronically Signed   By: Danae Orleans M.D.   On: 05/11/2020 13:32     ____________________________________________   PROCEDURES Procedures  ____________________________________________  DIFFERENTIAL DIAGNOSIS   No perforation, intra-abdominal abscess, seroma, hematoma, hernia  CLINICAL IMPRESSION / ASSESSMENT AND PLAN / ED COURSE  Medications ordered in the ED: Medications  sodium chloride 0.9 % bolus 1,000 mL (1,000 mLs Intravenous New Bag/Given 05/11/20 1237)  HYDROmorphone (DILAUDID) injection 1 mg (1 mg Intravenous Given 05/11/20 1239)  ondansetron (ZOFRAN) injection 4 mg (4 mg Intravenous Given 05/11/20 1237)  iohexol (OMNIPAQUE) 300 MG/ML solution 100 mL (100 mLs Intravenous Contrast Given 05/11/20 1308)    Pertinent labs & imaging results that were available during my care of the patient were reviewed by me and considered in my medical decision making (see chart for details).  Eric Valdez was evaluated in Emergency Department on 05/11/2020 for the symptoms described in the history of present illness. He was evaluated in the context of the global COVID-19 pandemic, which necessitated consideration that the patient might be at risk for infection with the SARS-CoV-2 virus that causes COVID-19. Institutional protocols and algorithms that pertain to the evaluation of patients at risk for COVID-19 are in a  state of rapid change based on information released by regulatory bodies including the CDC and federal and state organizations. These policies and algorithms were followed during the patient's care in the ED.   Patient presents with worsening abdominal pain 4 days after laparoscopic surgery.  Vital signs are normal, exam is nonreassuring, so labs and CT scan were obtained which are all unremarkable.  No evidence of any surgical complication.  Will recommend continued pain management at home, follow-up with the surgeon.      ____________________________________________   FINAL CLINICAL IMPRESSION(S) / ED DIAGNOSES    Final diagnoses:  Acute  post-operative pain     ED Discharge Orders    None      Portions of this note were generated with dragon dictation software. Dictation errors may occur despite best attempts at proofreading.   Sharman Cheek, MD 05/11/20 1434

## 2020-05-11 NOTE — ED Triage Notes (Signed)
Patient brought in by ems from home. Patient had a laparoscopic appendectomy on Thursday. Patient states that he woke up with increased pain this morning.

## 2020-05-14 ENCOUNTER — Encounter: Payer: Self-pay | Admitting: Surgery

## 2020-05-14 ENCOUNTER — Ambulatory Visit (INDEPENDENT_AMBULATORY_CARE_PROVIDER_SITE_OTHER): Payer: BC Managed Care – PPO | Admitting: Surgery

## 2020-05-14 ENCOUNTER — Telehealth: Payer: Self-pay

## 2020-05-14 ENCOUNTER — Other Ambulatory Visit: Payer: Self-pay

## 2020-05-14 VITALS — BP 121/81 | HR 69 | Temp 98.3°F | Ht 70.5 in | Wt 172.0 lb

## 2020-05-14 DIAGNOSIS — Z9049 Acquired absence of other specified parts of digestive tract: Secondary | ICD-10-CM

## 2020-05-14 DIAGNOSIS — K3580 Unspecified acute appendicitis: Secondary | ICD-10-CM

## 2020-05-14 DIAGNOSIS — R1084 Generalized abdominal pain: Secondary | ICD-10-CM

## 2020-05-14 MED ORDER — HYDROCODONE-ACETAMINOPHEN 5-325 MG PO TABS
1.0000 | ORAL_TABLET | Freq: Four times a day (QID) | ORAL | 0 refills | Status: DC | PRN
Start: 2020-05-14 — End: 2020-05-21

## 2020-05-14 NOTE — Progress Notes (Signed)
[facility] POST-OP OFFICE VISIT  05/14/2020  HPI: Eric Valdez is a 29 y.o. male 7 days s/p robotic appendectomy.  He reportedly felt excessive pain, associated nausea and was seen in the ER 2 to 3 days postop.  He underwent a thorough work-up there including CT scan which was consistent with postoperative changes.  He continues to have a degree of discomfort involving primarily the incisions, he is remarkably hypersensitive to the right lower quadrant area beginning to voluntary guard just touching the skin.  He denies any remarkable fevers or chills but reports irregular liquid loose bowel movements that are not well formed. He did have some extraordinary concerns about the suprapubic incision site.  Vital signs: BP 121/81   Pulse 69   Temp 98.3 F (36.8 C)   Ht 5' 10.5" (1.791 m)   Wt 172 lb (78 kg)   SpO2 97%   BMI 24.33 kg/m    Physical Exam: Constitutional: Appears at his typical pale baseline.  He moves readily from chair to table, even lifting his torso off the table utilizing both hands on the sides, like a pommel horse.   Abdomen: Hypersensitive to the right lower quadrant, distractible to confirm it is voluntary guarding.  No other areas of guarding or tenderness. Skin: Incisions all appear to be progressing well, there is the usual thickening with healing present.  No erythema, no evidence of discharge, no gaps or openings.  Assessment/Plan: This is a 29 y.o. male 1 week s/p robotic appendectomy for uncomplicated appendicitis.  However he has beyond the expected degree of discomfort at this point in his course.  Not amenable to reassurance, we will proceed with repeat CBC, and CT scan evaluation.  I will also give him an additional round of pain medication to last through the next few days anticipating improvement.  I will call him should there be any significant findings on a CT scan or lab work to warrant a change in plan.  Patient Active Problem List    Diagnosis Date Noted  . Appendicitis 05/07/2020    -CBC, CT scan, follow-up in 1 week.  Refill of Eric Valdez M.D., [facility] 05/14/2020, 10:54 AM

## 2020-05-14 NOTE — Telephone Encounter (Signed)
Patient has been scheduled for a CT abdomen/pelvis with contrast at [place] for tomorrow 05/15/20 at 11:30 am. He is to arrive there by 11:15 am and have nothing to eat or drink for 4 hours prior. He will pick up a prep kit today. Patient verbalizes understanding.

## 2020-05-14 NOTE — Patient Instructions (Addendum)
We will have you repeat a CT scan. You may have lab work done at the hospital or [facility]. We will call you with the results.  We will have you back next week for follow up.   You may leave you wounds open to air. You may cover loosly if needed while sleeping.  GENERAL POST-OPERATIVE PATIENT INSTRUCTIONS   WOUND CARE INSTRUCTIONS:  Keep a dry clean dressing on the wound if there is drainage. The initial bandage may be removed after 24 hours.  Once the wound has quit draining you may leave it open to air.  If clothing rubs against the wound or causes irritation and the wound is not draining you may cover it with a dry dressing during the daytime.  Try to keep the wound dry and avoid ointments on the wound unless directed to do so.  If the wound becomes bright red and painful or starts to drain infected material that is not clear, please contact your physician immediately.  If the wound is mildly pink and has a thick firm ridge underneath it, this is normal, and is referred to as a healing ridge.  This will resolve over the next 4-6 weeks.  BATHING: You may shower if you have been informed of this by your surgeon. However, Please do not submerge in a tub, hot tub, or pool until incisions are completely sealed or have been told by your surgeon that you may do so.  DIET:  You may eat any foods that you can tolerate.  It is a good idea to eat a high fiber diet and take in plenty of fluids to prevent constipation.  If you do become constipated you may want to take a mild laxative or take ducolax tablets on a daily basis until your bowel habits are regular.  Constipation can be very uncomfortable, along with straining, after recent surgery.  ACTIVITY:  You are encouraged to cough and deep breath or use your incentive spirometer if you were given one, every 15-30 minutes when awake.  This will help prevent respiratory complications and low grade fevers post-operatively if you had a general  anesthetic.  You may want to hug a pillow when coughing and sneezing to add additional support to the surgical area, if you had abdominal or chest surgery, which will decrease pain during these times.  You are encouraged to walk and engage in light activity for the next two weeks.  You should not lift more than 20 pounds, until 4 to 6 weeks after surgery as it could put you at increased risk for complications.  Twenty pounds is roughly equivalent to a plastic bag of groceries. At that time- Listen to your body when lifting, if you have pain when lifting, stop and then try again in a few days. Soreness after doing exercises or activities of daily living is normal as you get back in to your normal routine.  MEDICATIONS:  Try to take narcotic medications and anti-inflammatory medications, such as tylenol, ibuprofen, naprosyn, etc., with food.  This will minimize stomach upset from the medication.  Should you develop nausea and vomiting from the pain medication, or develop a rash, please discontinue the medication and contact your physician.  You should not drive, make important decisions, or operate machinery when taking narcotic pain medication.  SUNBLOCK Use sun block to incision area over the next year if this area will be exposed to sun. This helps decrease scarring and will allow you avoid a permanent darkened  area over your incision.  QUESTIONS:  Please feel free to call our office if you have any questions, and we will be glad to assist you. [phone].

## 2020-05-15 ENCOUNTER — Other Ambulatory Visit: Payer: Self-pay

## 2020-05-15 ENCOUNTER — Other Ambulatory Visit
Admission: RE | Admit: 2020-05-15 | Discharge: 2020-05-15 | Disposition: A | Discharge status: Home / Self Care | Payer: BC Managed Care – PPO | Source: Ambulatory Visit | Attending: Surgery | Admitting: Surgery

## 2020-05-15 ENCOUNTER — Ambulatory Visit
Admission: RE | Admit: 2020-05-15 | Discharge: 2020-05-15 | Disposition: A | Discharge status: Home / Self Care | Payer: BC Managed Care – PPO | Source: Ambulatory Visit | Attending: Surgery | Admitting: Surgery

## 2020-05-15 DIAGNOSIS — K3580 Unspecified acute appendicitis: Secondary | ICD-10-CM | POA: Insufficient documentation

## 2020-05-15 DIAGNOSIS — R1030 Lower abdominal pain, unspecified: Secondary | ICD-10-CM | POA: Diagnosis not present

## 2020-05-15 DIAGNOSIS — R110 Nausea: Secondary | ICD-10-CM | POA: Diagnosis not present

## 2020-05-15 DIAGNOSIS — R1084 Generalized abdominal pain: Secondary | ICD-10-CM

## 2020-05-15 LAB — CBC WITH DIFFERENTIAL/PLATELET
Abs Immature Granulocytes: 0.02 10*3/uL (ref 0.00–0.07)
Basophils Absolute: 0 10*3/uL (ref 0.0–0.1)
Basophils Relative: 1 %
Eosinophils Absolute: 0.2 10*3/uL (ref 0.0–0.5)
Eosinophils Relative: 4 %
HCT: 37.4 % — ABNORMAL LOW (ref 39.0–52.0)
Hemoglobin: 12.8 g/dL — ABNORMAL LOW (ref 13.0–17.0)
Immature Granulocytes: 0 %
Lymphocytes Relative: 23 %
Lymphs Abs: 1.2 10*3/uL (ref 0.7–4.0)
MCH: 32 pg (ref 26.0–34.0)
MCHC: 34.2 g/dL (ref 30.0–36.0)
MCV: 93.5 fL (ref 80.0–100.0)
Monocytes Absolute: 0.4 10*3/uL (ref 0.1–1.0)
Monocytes Relative: 8 %
Neutro Abs: 3.4 10*3/uL (ref 1.7–7.7)
Neutrophils Relative %: 64 %
Platelets: 301 10*3/uL (ref 150–400)
RBC: 4 MIL/uL — ABNORMAL LOW (ref 4.22–5.81)
RDW: 13.6 % (ref 11.5–15.5)
WBC: 5.3 10*3/uL (ref 4.0–10.5)
nRBC: 0 % (ref 0.0–0.2)

## 2020-05-15 MED ORDER — IOHEXOL 300 MG/ML  SOLN
100.0000 mL | Freq: Once | INTRAMUSCULAR | Status: AC | PRN
  Administered 2020-05-15: 100 mL via INTRAVENOUS

## 2020-05-21 ENCOUNTER — Other Ambulatory Visit: Payer: Self-pay

## 2020-05-21 ENCOUNTER — Encounter: Payer: BC Managed Care – PPO | Admitting: Surgery

## 2020-05-21 ENCOUNTER — Encounter: Payer: Self-pay | Admitting: Surgery

## 2020-05-21 ENCOUNTER — Ambulatory Visit (INDEPENDENT_AMBULATORY_CARE_PROVIDER_SITE_OTHER): Payer: BC Managed Care – PPO | Admitting: Surgery

## 2020-05-21 VITALS — BP 109/59 | HR 103 | Temp 98.4°F | Ht 70.5 in | Wt 160.2 lb

## 2020-05-21 DIAGNOSIS — Z9049 Acquired absence of other specified parts of digestive tract: Secondary | ICD-10-CM

## 2020-05-21 MED ORDER — IBUPROFEN 800 MG PO TABS
800.0000 mg | ORAL_TABLET | Freq: Three times a day (TID) | ORAL | 0 refills | Status: DC | PRN
Start: 2020-05-21 — End: 2020-06-04

## 2020-05-21 NOTE — Patient Instructions (Addendum)
We recommend a short term Ibuprofen medication regimen.  Take 800mg  of Ibuprofen three times a day for the next two weeks.   May also use heat or ice to the affected area three times a day for 15 minutes at a time.   Have your work fax over any paperwork we may need to fill out for FMLA or disability.  Fax: [phone]  Return to work about September 27th.  Follow up here in 3 weeks.

## 2020-05-21 NOTE — Progress Notes (Signed)
[facility] POST-OP OFFICE VISIT  05/21/2020  HPI: Eric Valdez is a 29 y.o. male 14 days s/p robotic appendectomy. Since that time has been in the ER once, followed up with [place] and had a second postoperative CT to confirm the absence of any tangible source of his lower abdominal wall pain. His CBC is also been normal on both occasions as well. He complains of pain from the mostcentral incision at the suprapubic area. It is possible he may have a degree of abdominal wall or rectus hematoma at that site, however I cannot appreciate this on CT or on exam. 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 He denies fevers or chills, he denies nausea or vomiting. He reports good appetite. He is utilized occasional acetaminophen or ibuprofen but not on a consistent basis just as needed basis and seems to prevent him from sleeping well.  Vital signs: BP (!) 109/59   Pulse (!) 103   Temp 98.4 F (36.9 C)   Ht 5' 10.5" (1.791 m)   Wt 160 lb 3.2 oz (72.7 kg)   SpO2 95%   BMI 22.66 kg/m    Physical Exam: Constitutional: He appears well, and moves rather freely despite the degree of pain described. Abdomen: Nondistended, flat benign on exam. No palpable mass-effect any of his incisions and the only area of tenderness is the suprapubic area. Skin: All incisions are clean dry and well-healed. There is no evidence of any erythema or induration or ecchymosis present.  Assessment/Plan: This is a 29 y.o. male 14 days s/p robotic appendectomy.  Patient Active Problem List   Diagnosis Date Noted  . Status post laparoscopic appendectomy 05/14/2020    -Encouraged him to utilize a regular scheduled dose of ibuprofen 600-800 with food. Reevaluate in 2 weeks. It appears he has some concerns about his FMLA forms or paperwork or taking a short-term disability leave in order to accommodate his unplanned persistence in discomfort postoperatively.  Hopefully this will be helpful for this gentleman and will reevaluate him within the next couple weeks. I am expectant of improvement.   Campbell Lerner M.D., FACS 05/21/2020, 11:52 AM

## 2020-06-04 ENCOUNTER — Telehealth: Payer: Self-pay | Admitting: *Deleted

## 2020-06-04 ENCOUNTER — Ambulatory Visit (INDEPENDENT_AMBULATORY_CARE_PROVIDER_SITE_OTHER): Payer: BC Managed Care – PPO | Admitting: Surgery

## 2020-06-04 ENCOUNTER — Other Ambulatory Visit: Payer: Self-pay

## 2020-06-04 ENCOUNTER — Encounter: Payer: Self-pay | Admitting: Surgery

## 2020-06-04 VITALS — BP 113/67 | HR 88 | Temp 98.6°F | Resp 12 | Ht 71.0 in | Wt 162.0 lb

## 2020-06-04 DIAGNOSIS — Z9049 Acquired absence of other specified parts of digestive tract: Secondary | ICD-10-CM

## 2020-06-04 NOTE — Telephone Encounter (Signed)
Faxed to [employer] [phone]

## 2020-06-04 NOTE — Progress Notes (Signed)
[facility] POST-OP OFFICE VISIT  06/04/2020  HPI: Eric Valdez is a 29 y.o. male 27 days s/p robotic appendectomy.  His postoperative recovery course has been more challenging than I think he anticipated.  His bowel movements have lessened in his concern and are essentially normal now.  He denies nausea and vomiting.  He denies fevers and chills.  Reports his appetite has improved.  His pain tolerance is also improved, was requesting additional ibuprofen.  Vital signs: BP 113/67   Pulse 88   Temp 98.6 F (37 C) (Oral)   Resp 12   Ht 5\' 11"  (1.803 m)   Wt 162 lb (73.5 kg)   SpO2 98%   BMI 22.59 kg/m    Physical Exam: Constitutional: He appears well, at his baseline. Abdomen: Benign, soft and nontender. Skin: All incisions appear to be healing well.  Assessment/Plan: This is a 29 y.o. male 27 days s/p robotic appendectomy.  Patient Active Problem List   Diagnosis Date Noted  . Status post laparoscopic appendectomy 05/14/2020    -Essentially this was an uncomplicated appendicitis.  He should return to work Monday next week without restrictions.  Most of our discussion revolved around the [facility] and short-term disability paperwork. He can follow-up with [facility] as needed.   [place] M.D., FACS 06/04/2020, 10:24 AM

## 2020-06-04 NOTE — Patient Instructions (Signed)

## 2020-07-19 ENCOUNTER — Telehealth: Payer: BC Managed Care – PPO | Admitting: Family

## 2020-07-19 DIAGNOSIS — A084 Viral intestinal infection, unspecified: Secondary | ICD-10-CM

## 2020-07-19 MED ORDER — ONDANSETRON HCL 4 MG PO TABS: 4.0000 mg | ORAL_TABLET | Freq: Three times a day (TID) | ORAL | 0 refills | Status: DC | PRN

## 2020-07-19 NOTE — Progress Notes (Signed)
We are sorry that you are not feeling well. Here is how we plan to help!  Based on what you have shared with me it looks like you have a Virus that is irritating your GI tract.  Vomiting is the forceful emptying of a portion of the stomach's content through the mouth.  Although nausea and vomiting can make you feel miserable, it's important to remember that these are not diseases, but rather symptoms of an underlying illness.  When we treat short term symptoms, we always caution that any symptoms that persist should be fully evaluated in a medical office.  I have prescribed a medication that will help alleviate your symptoms and allow you to stay hydrated:  Zofran 4 mg 1 tablet every 8 hours as needed for nausea and vomiting.  I also recommend you be COVID tested to rule out, given your symptoms with a cough.   HOME CARE:  Drink clear liquids.  This is very important! Dehydration (the lack of fluid) can lead to a serious complication.  Start off with 1 tablespoon every 5 minutes for 8 hours.  You may begin eating bland foods after 8 hours without vomiting.  Start with saltine crackers, white bread, rice, mashed potatoes, applesauce.  After 48 hours on a bland diet, you may resume a normal diet.  Try to go to sleep.  Sleep often empties the stomach and relieves the need to vomit.  GET HELP RIGHT AWAY IF:   Your symptoms do not improve or worsen within 2 days after treatment.  You have a fever for over 3 days.  You cannot keep down fluids after trying the medication.  MAKE SURE YOU:   Understand these instructions.  Will watch your condition.  Will get help right away if you are not doing well or get worse.   Thank you for choosing an e-visit. Your e-visit answers were reviewed by a board certified advanced clinical practitioner to complete your personal care plan. Depending upon the condition, your plan could have included both over the counter or prescription  medications. Please review your pharmacy choice. Be sure that the pharmacy you have chosen is open so that you can pick up your prescription now.  If there is a problem you may message your provider in MyChart to have the prescription routed to another pharmacy. Your safety is important to [place]. If you have drug allergies check your prescription carefully.  For the next 24 hours, you can use MyChart to ask questions about today's visit, request a non-urgent call back, or ask for a work or school excuse from your e-visit provider. You will get an e-mail in the next two days asking about your experience. I hope that your e-visit has been valuable and will speed your recovery.   Approximately 5 minutes was spent documenting and reviewing patient's chart.

## 2020-08-17 ENCOUNTER — Telehealth: Payer: BC Managed Care – PPO | Admitting: Emergency Medicine

## 2020-08-17 DIAGNOSIS — J329 Chronic sinusitis, unspecified: Secondary | ICD-10-CM

## 2020-08-17 MED ORDER — IPRATROPIUM BROMIDE 0.03 % NA SOLN: 2.0000 | Freq: Two times a day (BID) | NASAL | 0 refills | Status: DC

## 2020-08-17 NOTE — Progress Notes (Signed)
We are sorry that you are not feeling well.  Here is how we plan to help!  Based on what you have shared with me it looks like you have sinusitis.  Sinusitis is inflammation and infection in the sinus cavities of the head.  Based on your presentation I believe you most likely have Acute Viral Sinusitis.This is an infection most likely caused by a virus. There is not specific treatment for viral sinusitis other than to help you with the symptoms until the infection runs its course.  You may use an oral decongestant such as Mucinex D or if you have glaucoma or high blood pressure use plain Mucinex. Saline nasal spray help and can safely be used as often as needed for congestion, I have prescribed: Ipratropium Bromide nasal spray 0.03% 2 sprays in eah nostril 2-3 times a day  Some authorities believe that zinc sprays or the use of Echinacea may shorten the course of your symptoms.  Sinus infections are not as easily transmitted as other respiratory infection, however we still recommend that you avoid close contact with loved ones, especially the very young and elderly.  Remember to wash your hands thoroughly throughout the day as this is the number one way to prevent the spread of infection!  Our work notes are electronic, so I can't back date it, but I'll excuse you through 12/7.  You'll find the note in myChart.  Home Care:  Only take medications as instructed by your medical team.  Do not take these medications with alcohol.  A steam or ultrasonic humidifier can help congestion.  You can place a towel over your head and breathe in the steam from hot water coming from a faucet.  Avoid close contacts especially the very young and the elderly.  Cover your mouth when you cough or sneeze.  Always remember to wash your hands.  Get Help Right Away If:  You develop worsening fever or sinus pain.  You develop a severe head ache or visual changes.  Your symptoms persist after you have completed  your treatment plan.  Make sure you  Understand these instructions.  Will watch your condition.  Will get help right away if you are not doing well or get worse.  Your e-visit answers were reviewed by a board certified advanced clinical practitioner to complete your personal care plan.  Depending on the condition, your plan could have included both over the counter or prescription medications.  If there is a problem please reply  once you have received a response from your provider.  Your safety is important to [place].  If you have drug allergies check your prescription carefully.    You can use MyChart to ask questions about today's visit, request a non-urgent call back, or ask for a work or school excuse for 24 hours related to this e-Visit. If it has been greater than 24 hours you will need to follow up with your provider, or enter a new e-Visit to address those concerns.  You will get an e-mail in the next two days asking about your experience.  I hope that your e-visit has been valuable and will speed your recovery. Thank you for using e-visits.   Approximately 5 minutes was used in reviewing the patient's chart, questionnaire, prescribing medications, and documentation.

## 2020-09-17 DIAGNOSIS — F331 Major depressive disorder, recurrent, moderate: Secondary | ICD-10-CM | POA: Diagnosis not present

## 2020-09-24 DIAGNOSIS — F331 Major depressive disorder, recurrent, moderate: Secondary | ICD-10-CM | POA: Diagnosis not present

## 2021-01-18 DIAGNOSIS — Z1389 Encounter for screening for other disorder: Secondary | ICD-10-CM | POA: Diagnosis not present

## 2021-01-18 DIAGNOSIS — F319 Bipolar disorder, unspecified: Secondary | ICD-10-CM | POA: Diagnosis not present

## 2021-01-18 DIAGNOSIS — Z79899 Other long term (current) drug therapy: Secondary | ICD-10-CM | POA: Diagnosis not present

## 2021-02-16 DIAGNOSIS — F419 Anxiety disorder, unspecified: Secondary | ICD-10-CM | POA: Diagnosis not present

## 2021-02-16 DIAGNOSIS — F319 Bipolar disorder, unspecified: Secondary | ICD-10-CM | POA: Diagnosis not present

## 2021-10-13 IMAGING — CT CT ABD-PELV W/ CM
2 of 4 series · 16 of 46 positions shown, 18 images · IV contrast (APPLIED)
Comparison: None available.

CLINICAL DATA: Initial evaluation for acute right lower quadrant
pain.

EXAM:
CT ABDOMEN AND PELVIS WITH CONTRAST
TECHNIQUE: Multidetector CT imaging of the abdomen and pelvis was performed
using the standard protocol following bolus administration of
intravenous contrast.
CONTRAST:  100mL OMNIPAQUE IOHEXOL 300 MG/ML  SOLN

[Series 2: routine abd/pel with · axial · 0.82mm/px · z∈[-878,-458]mm · 13 of 92 slices shown, 15 images]
[im 4/92  soft-tissue]
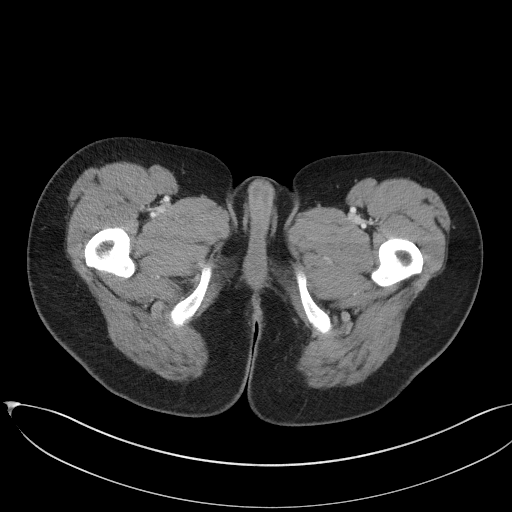
[im 4/92  bone]
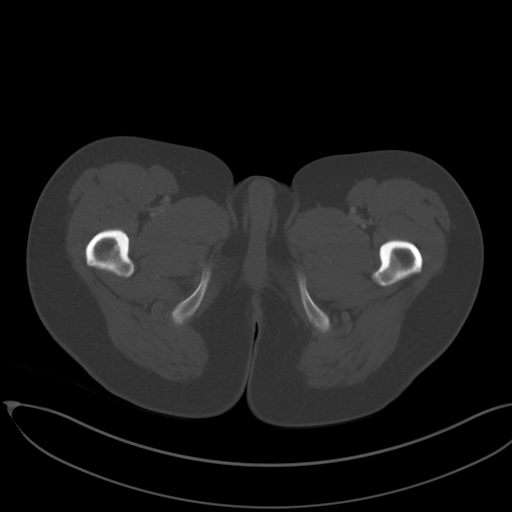
[im 12/92  soft-tissue]
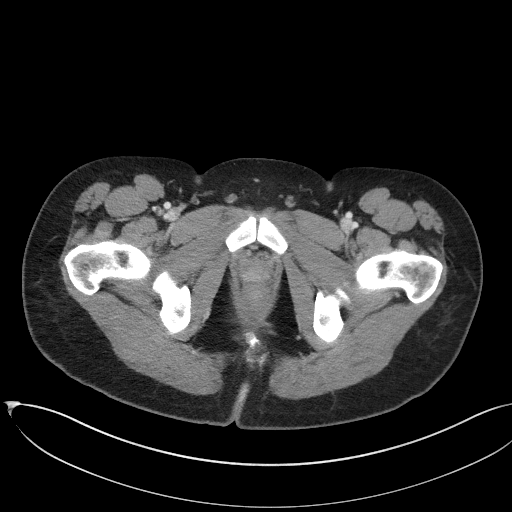
[im 20/92  soft-tissue]
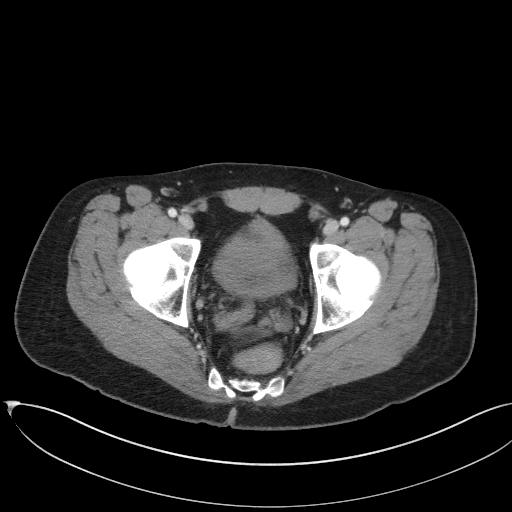
[im 24/92  soft-tissue]
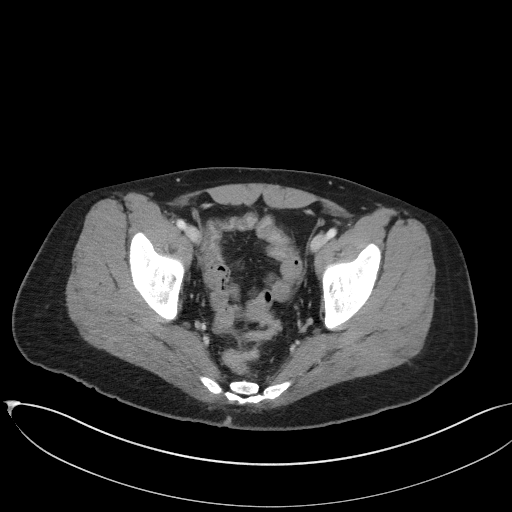
[im 32/92  soft-tissue]
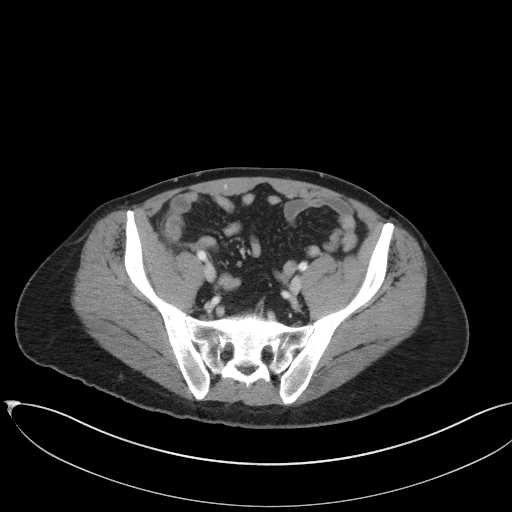
[im 40/92  soft-tissue]
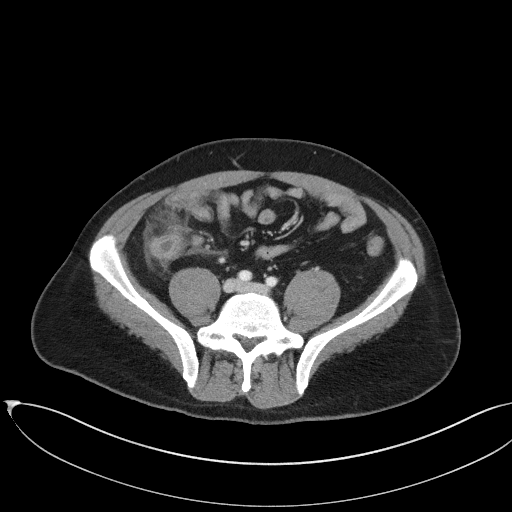
[im 48/92  soft-tissue]
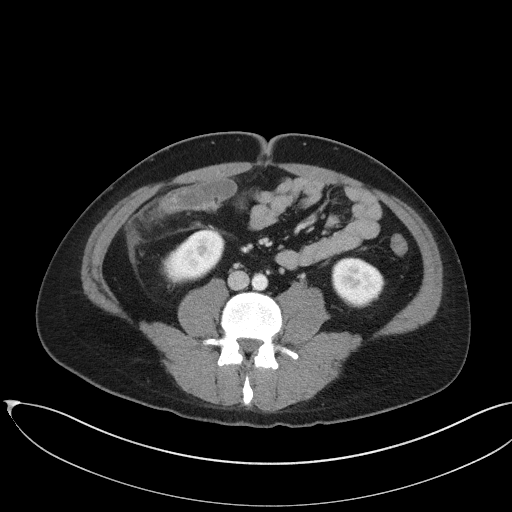
[im 52/92  soft-tissue]
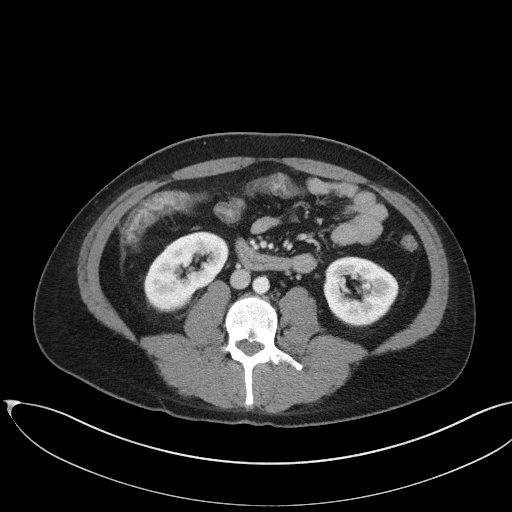
[im 60/92  soft-tissue]
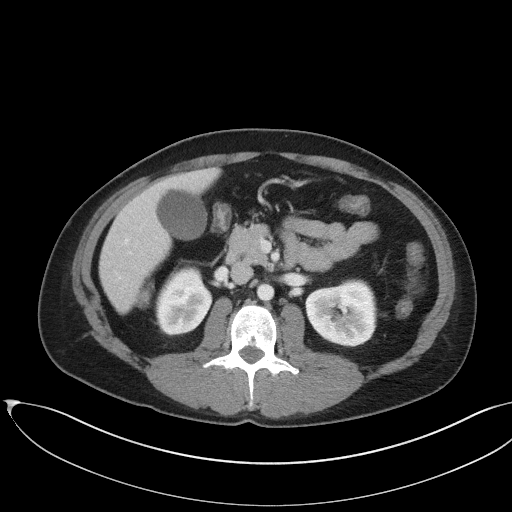
[im 60/92  bone]
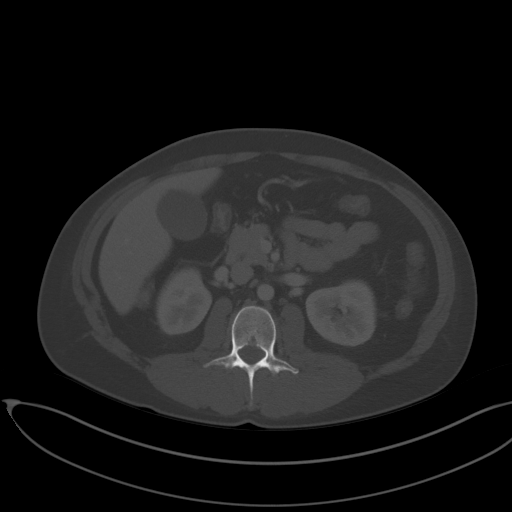
[im 68/92  soft-tissue]
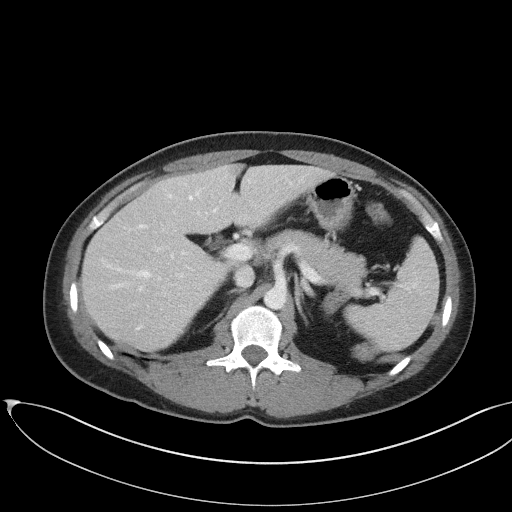
[im 72/92  soft-tissue]
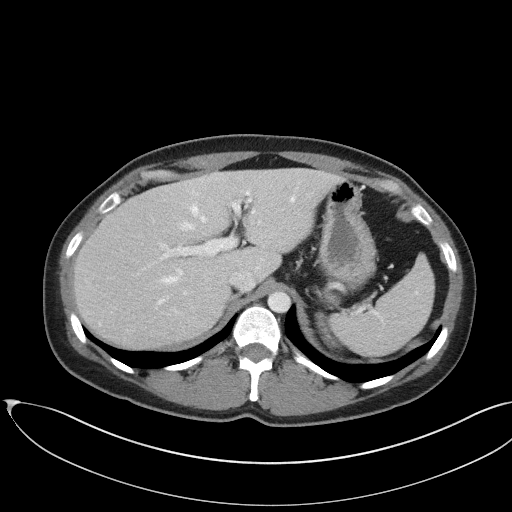
[im 80/92  soft-tissue]
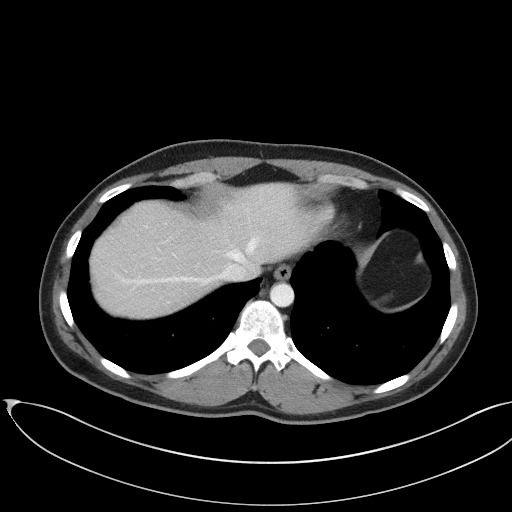
[im 88/92  soft-tissue]
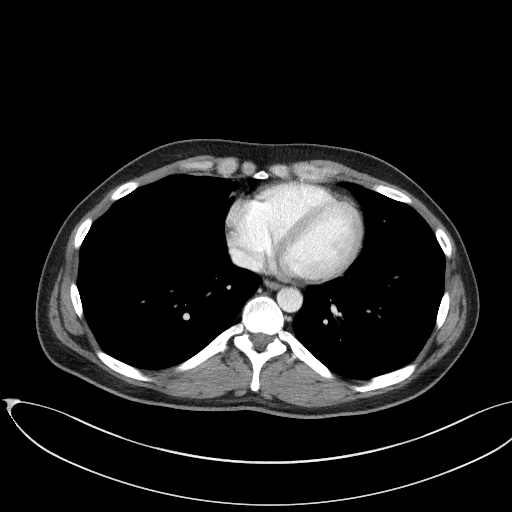

[Series 5: coronal st · coronal · 0.76mm/px · 3 of 77 slices shown]
[im 26/77  soft-tissue]
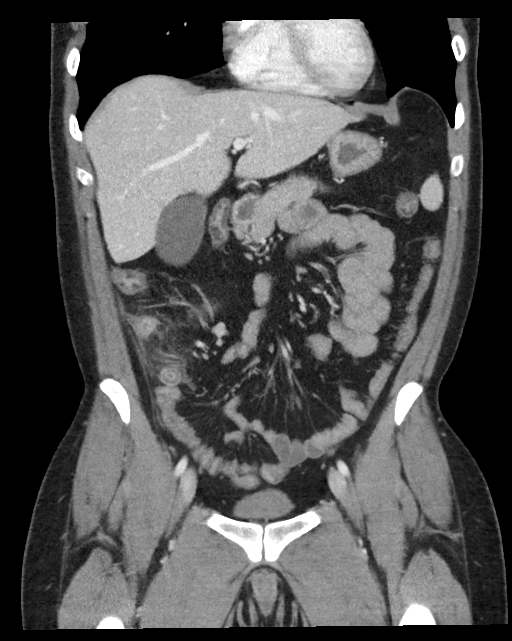
[im 34/77  soft-tissue]
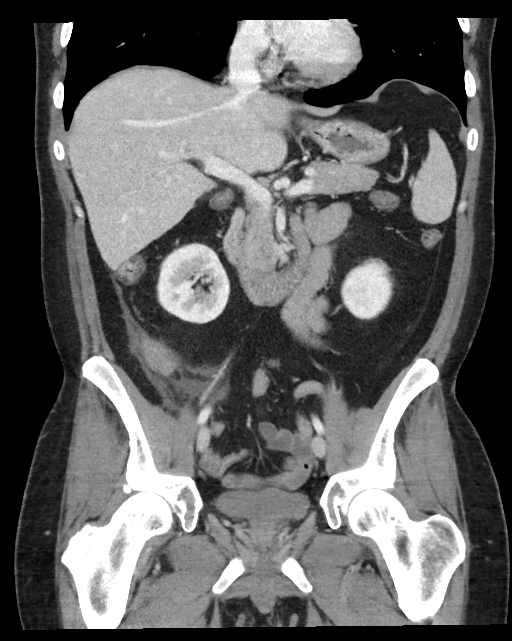
[im 43/77  soft-tissue]
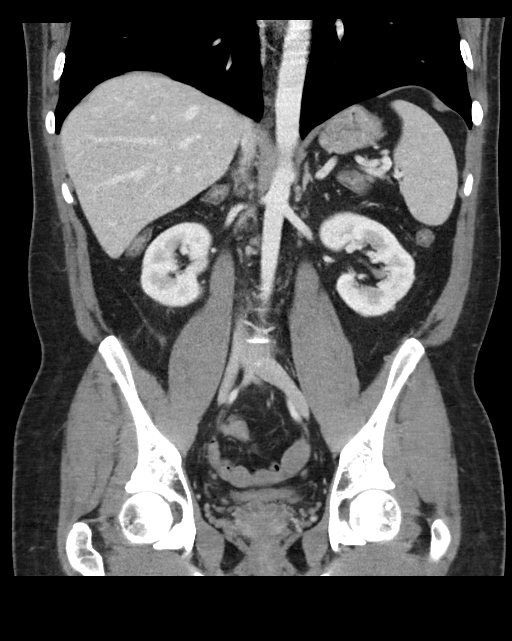

[16 of 46 positions shown; findings below may reference images not displayed]

FINDINGS: Lower chest: Visualized lung bases are clear.

Hepatobiliary: Liver demonstrates a normal contrast enhanced
appearance. Gallbladder within normal limits. No biliary dilatation.

Pancreas: Pancreas within normal limits.

Spleen: Spleen within normal limits.

Adrenals/Urinary Tract: Adrenal glands are normal. Kidneys equal
size with symmetric enhancement. No nephrolithiasis, hydronephrosis,
or focal enhancing renal mass. No hydroureter. Partially distended
bladder within normal limits.

Stomach/Bowel: Stomach within normal limits. No evidence for bowel
obstruction.

Appendix: Location: Appendix located within the right lower
quadrant, retrocecal in location (series 2, image 52).

Diameter: Up to 19 mm.

Appendicolith: No visible appendicoliths.

Mucosal hyper-enhancement: Prominent mucosal enhancement and wall
thickening.

Extraluminal gas: No extraluminal gas to suggest perforation.

Periappendiceal collection: Prominent periappendiceal fat stranding
and phlegmonous change without frank abscess or collection.

Mild wall thickening about the adjacent ileum favored to be
reactive. Colon largely decompressed. No other acute inflammatory
changes seen about the bowels.

Vascular/Lymphatic: Normal intravascular enhancement seen throughout
the intra-abdominal aorta. Mesenteric vessels patent proximally.
Prominent 1 cm mesenteric lymph node noted within the right lower
quadrant, likely reactive. No other adenopathy.

Reproductive: Prostate and seminal vesicles within normal limits.

Other: Small volume free fluid within the pelvis, reactive. No free
intraperitoneal air.

Musculoskeletal: No acute osseous abnormality. No discrete or
worrisome osseous lesions.
IMPRESSION: Findings consistent with acute appendicitis. No evidence for
perforation or other complication.

## 2023-10-29 ENCOUNTER — Ambulatory Visit (INDEPENDENT_AMBULATORY_CARE_PROVIDER_SITE_OTHER): Payer: BC Managed Care – PPO

## 2023-10-29 ENCOUNTER — Ambulatory Visit
Admission: EM | Admit: 2023-10-29 | Discharge: 2023-10-29 | Disposition: A | Discharge status: Home / Self Care | Payer: Worker's Compensation | Attending: Emergency Medicine | Admitting: Emergency Medicine

## 2023-10-29 DIAGNOSIS — S5002XA Contusion of left elbow, initial encounter: Secondary | ICD-10-CM | POA: Diagnosis not present

## 2023-10-29 DIAGNOSIS — S8001XA Contusion of right knee, initial encounter: Secondary | ICD-10-CM

## 2023-10-29 DIAGNOSIS — S7002XA Contusion of left hip, initial encounter: Secondary | ICD-10-CM

## 2023-10-29 DIAGNOSIS — W19XXXA Unspecified fall, initial encounter: Secondary | ICD-10-CM | POA: Diagnosis not present

## 2023-10-29 MED ORDER — IBUPROFEN 600 MG PO TABS: 600.0000 mg | ORAL_TABLET | Freq: Three times a day (TID) | ORAL | 0 refills | Status: AC | PRN

## 2023-10-29 NOTE — ED Provider Notes (Signed)
HPI  SUBJECTIVE:  Eric Valdez is a 33 y.o. male who presents with left arm, hip, and right knee pain after having a slip and fall on some water while at work 3 days ago.  He states that he fell on his left hip/side, but must have hit his right knee in the process.  He denies head, neck, back injury/pain.  He has abrasions and a bruise in the supracondylar region of his left arm, but has been able to use it without any problems.    He reports constant left hip soreness that is getting worse, with a contusion where he fell.  He has been ambulatory since the fall.  No limited motion of the hip.  No distal numbness or tingling.  He tried stretching his quadriceps which made his symptoms worse, has been taking Tylenol 1000 mg combined with 400 mg of ibuprofen once or twice a day with improvement in his symptoms.  Symptoms are worse with any hip movement and with walking.  He also reports soreness and a contusion at the inferior pole of his right patella.  Denies knee giving way or sensation of instability, swelling, erythema.  His knee pain is controlled with Tylenol/ ibuprofen, and worse with movement.  He has been ambulatory on this knee without problem.  He has a past medical history of chronic back pain.  No osteoporosis, chronic kidney disease.  PCP: None.  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    History reviewed. No pertinent past medical history.  Past Surgical History:  Procedure Laterality Date   XI ROBOTIC LAPAROSCOPIC ASSISTED APPENDECTOMY N/A 05/07/2020   Procedure: XI ROBOTIC LAPAROSCOPIC ASSISTED APPENDECTOMY;  Surgeon: Campbell Lerner, MD;  Location: [facility] ORS;  Service: General;  Laterality: N/A;    History reviewed. No pertinent family history.  Social History   Tobacco Use   Smoking status: Never   Smokeless tobacco: Never  Vaping Use   Vaping status: Never Used  Substance Use Topics   Alcohol use: Not Currently   Drug use: Not Currently    No current  facility-administered medications for this encounter.  Current Outpatient Medications:    ibuprofen (ADVIL) 600 MG tablet, Take 1 tablet (600 mg total) by mouth every 8 (eight) hours as needed., Disp: 30 tablet, Rfl: 0   ipratropium (ATROVENT) 0.03 % nasal spray, Place 2 sprays into both nostrils every 12 (twelve) hours., Disp: 30 mL, Rfl: 0   ondansetron (ZOFRAN) 4 MG tablet, Take 1 tablet (4 mg total) by mouth every 8 (eight) hours as needed for nausea or vomiting., Disp: 20 tablet, Rfl: 0  Allergies  Allergen Reactions   Penicillins Other (See Comments)    Told by father     ROS  As noted in HPI.   Physical Exam  BP 108/73 (BP Location: Left Arm)   Pulse 81   Temp 98.4 F (36.9 C) (Oral)   Wt 77.1 kg   SpO2 95%   BMI 23.71 kg/m   Constitutional: Well developed, well nourished, no acute distress Eyes:  EOMI, conjunctiva normal bilaterally HENT: Normocephalic, atraumatic,mucus membranes moist Respiratory: Normal inspiratory effort Cardiovascular: Normal rate GI: nondistended skin: See MSK exam Musculoskeletal: Patient ambulatory in the department. Left elbow ROM Normal for Pt , Supracondylar region mildly tender with contusion, Radial head NT , Olecrenon process NT , Medial epicondyle NT, Lateral epicondyle NT,  Shoulder NT, Wrist NT, Hand NT with distal NVI CR<2secs, radial pulse intact, Sensation LT and Motor intact distally in the median, radial,  and ulnar nerve distibution   Left hip: Large bruise with soft tissue swelling lateral upper hip. No erythema, rash. No muscular tenderness over gluteal muscles, down IT band, quadriceps.  Patient able to perform full AROM, but pain aggravated with active hip abduction, internal/external rotation, extension.  No pain/pain with passive abduction/adduction of leg.. No tenderness at sciatic notch. Flexion/extension knee WNL. Knee joint NT, stable. Motor strength flexion/ext hip 5/5. Sensation to LT intact. DP 2+    R  Knee:  Positive contusion, tenderness inferior aspect of the patella.  No crepitus, deformity.  ROM baseline for Pt, Flexion  intact ,  Patellar tendon NT, Medial joint NT, Lateral joint NT, Popliteal region NT, Varus MCL stress testing stable, Valgus LCL stress testing stable, ACL/PCL stable, McMurray negative,  Distal NVI with intact baseline sensation / motor / pulse distal to knee.  No effusion. No erythema. No increased temperature. No crepitus.   Neurologic: Alert & oriented x 3, no focal neuro deficits Psychiatric: Speech and behavior appropriate   ED Course   Medications - No data to display  Orders Placed This Encounter  Procedures   DG Elbow Complete Left    Standing Status:   Standing    Number of Occurrences:   1    Reason for Exam (SYMPTOM  OR DIAGNOSIS REQUIRED):   fall supracondulary tenderness r/o fx   DG Hip Unilat With Pelvis 2-3 Views Left    Standing Status:   Standing    Number of Occurrences:   1    Symptom/Reason for Exam:   Left hip pain [321992]    Call Results- Best Contact Number?:   Left lateral hip contusion status post fall rule out fracture   DG Knee AP/LAT W/Sunrise Right    Standing Status:   Standing    Number of Occurrences:   1    Reason for Exam (SYMPTOM  OR DIAGNOSIS REQUIRED):   Direct trauma, bruising patella rule out fracture.    No results found for this or any previous visit (from the past 24 hours). DG Hip Unilat With Pelvis 2-3 Views Left Result Date: 10/29/2023 CLINICAL DATA:  221992 Left hip pain 221992 EXAM: DG HIP (WITH OR WITHOUT PELVIS) 2-3V LEFT COMPARISON:  None Available. FINDINGS: No acute fracture or dislocation. Joint spaces and alignment are maintained. No area of erosion or osseous destruction. No unexpected radiopaque foreign body. Mild soft tissue edema of the lateral LEFT leg. IMPRESSION: 1. No acute fracture or dislocation. 2. Mild soft tissue edema of the lateral LEFT leg. Electronically Signed   By: Meda Klinefelter M.D.   On:  10/29/2023 14:58   DG Elbow Complete Left Result Date: 10/29/2023 CLINICAL DATA:  fall supracondulary tenderness r/o fx EXAM: LEFT ELBOW - COMPLETE 3+ VIEW COMPARISON:  None Available. FINDINGS: No acute fracture or dislocation. Joint spaces and alignment are maintained. No area of erosion or osseous destruction. No unexpected radiopaque foreign body. Mild soft tissue edema. IMPRESSION: 1. No acute fracture or dislocation. Electronically Signed   By: Meda Klinefelter M.D.   On: 10/29/2023 14:57   DG Knee AP/LAT W/Sunrise Right Result Date: 10/29/2023 CLINICAL DATA:  Direct trauma, bruising patella.  Rule out fracture. EXAM: RIGHT KNEE 3 VIEWS COMPARISON:  None Available. FINDINGS: No acute fracture or dislocation. Joint spaces and alignment are maintained. No area of erosion or osseous destruction. No unexpected radiopaque foreign body. Soft tissues are unremarkable. IMPRESSION: No acute fracture or dislocation. Electronically Signed   By: Meda Klinefelter  M.D.   On: 10/29/2023 14:52    ED Clinical Impression  1. Contusion of left elbow, initial encounter   2. Contusion of left hip, initial encounter   3. Contusion of right knee, initial encounter   4. Fall, initial encounter      ED Assessment/Plan     Will x-ray left elbow, left hip and right knee to rule out fracture.   Reviewed imaging independently.   Left elbow normal.  Mild soft tissue swelling Left hip normal.  mild soft tissue swelling Right knee normal.  Will contact patient at [phone] if radiology overread differs enough from mine and we need to change management.  Reviewed radiology reports.  No fractures consistent with my read.  See radiology report for full details.  Patient presents with multiple contusions post fall.  There is no fracture on any x-ray.  Home with Tylenol/ibuprofen.  Ice/heat on the contusions.  patient did not bring any specific paperwork in with him.  Will write work note for light  duty/activity as tolerated and he is to follow-up with occupational health in a week for full return to work.  Discussed imaging, MDM, treatment plan, and plan for follow-up with patient. Discussed sn/sx that should prompt return to the ED. patient agrees with plan.   Spent 45 minutes in the care of this patient.  Meds ordered this encounter  Medications   ibuprofen (ADVIL) 600 MG tablet    Sig: Take 1 tablet (600 mg total) by mouth every 8 (eight) hours as needed.    Dispense:  30 tablet    Refill:  0      *This clinic note was created using Scientist, clinical (histocompatibility and immunogenetics). Therefore, there may be occasional mistakes despite careful proofreading.  ?    Domenick Gong, MD 10/30/23 1227

## 2023-10-29 NOTE — ED Triage Notes (Signed)
Pt c/o Fall while at work on 10/26/23 at 7:30pm  Pt states that he works at [employer] and while walking into the back of the building he slipped and fell next to the dishwasher due to a water spill. Pt is wearing slip resistant shoes. Pt landed on the left side and hit his hip. Pt is having left hip pain, Left side pain, and right knee pain.  Pt was witnessed fall by his boss and Agricultural consultant.  Pt was told to come to the nearby   Pt states that he has bruises along his right knee and left arm

## 2023-10-29 NOTE — Discharge Instructions (Addendum)
Your x-rays are all negative for acute fracture.  Take 600 mg ibuprofen, 1000 mg of Tylenol 3 or 4 times a day as needed for pain.  Heat or ice on your bruises, whichever feels better.  Light duty/activity as tolerated.  Follow-up with occupational health in a week to be fully released back to work.  [facility] Address: [address] [place], [place] [address] Hours:  Open 24 hours Phone: [phone]  Follow-up with these folks ASAP.

## 2023-10-30 NOTE — ED Notes (Signed)
Patient screened positive but low risk with the suicidality screening tool during triage.  Patient states that he has had vague thoughts of wanting to not be here anymore, but he has had these for some time.  He has no plan.  He has a past medical history of depression, ADD, severe anxiety and has seen a psychologist and psychiatrist in the past.  Unfortunately, he has not been able to continue care with them because of finances and lack of transportation.  No psychiatric admissions or history of suicide attempts.  He currently denies active suicidal ideation,  is lucid with goal oriented thinking, with no evidence of mania or psychosis.  I believe that he is safe to go home with outpatient follow-up.  He states that he has good social support with family and friends.  Gave him behavioral health resources and encouraged him to follow-up with behavioral health ASAP.  He will go to the ED if he becomes more depressed, has worsening or more specific suicidal ideation/plan, or for any concerns.  He agrees with this plan.   Diagnosis: Depression, anxiety.       Domenick Gong, MD 10/30/23 1240

## 2023-11-30 ENCOUNTER — Ambulatory Visit
Admission: EM | Admit: 2023-11-30 | Discharge: 2023-11-30 | Disposition: A | Discharge status: Home / Self Care | Attending: Physician Assistant | Admitting: Physician Assistant

## 2023-11-30 ENCOUNTER — Encounter: Payer: Self-pay | Admitting: Emergency Medicine

## 2023-11-30 DIAGNOSIS — H6123 Impacted cerumen, bilateral: Secondary | ICD-10-CM

## 2023-11-30 DIAGNOSIS — H60503 Unspecified acute noninfective otitis externa, bilateral: Secondary | ICD-10-CM

## 2023-11-30 MED ORDER — CIPROFLOXACIN-DEXAMETHASONE 0.3-0.1 % OT SUSP: 4.0000 [drp] | Freq: Two times a day (BID) | OTIC | 0 refills | Status: AC

## 2023-11-30 NOTE — ED Triage Notes (Signed)
Pt presents with left ear pain and fullness x 2 days

## 2023-11-30 NOTE — ED Provider Notes (Signed)
 [facility]    CSN: [id number] Arrival date & time: 11/30/23  1431      History   Chief Complaint Chief Complaint  Patient presents with   Otalgia    HPI Eric Valdez is a 33 y.o. male presenting for bilateral ear fullness and pressure, worse on the left side.  Symptoms have been ongoing for the past 2 days.  Reports he cannot hear out of left-sided.  Denies cough, congestion, sinus discomfort, dizziness or headaches.  HPI  History reviewed. No pertinent past medical history.  Patient Active Problem List   Diagnosis Date Noted   Status post laparoscopic appendectomy 05/14/2020    Past Surgical History:  Procedure Laterality Date   XI ROBOTIC LAPAROSCOPIC ASSISTED APPENDECTOMY N/A 05/07/2020   Procedure: XI ROBOTIC LAPAROSCOPIC ASSISTED APPENDECTOMY;  Surgeon: Campbell Lerner, MD;  Location: [facility] ORS;  Service: General;  Laterality: N/A;       Home Medications    Prior to Admission medications   Medication Sig Start Date End Date Taking? Authorizing Provider  ciprofloxacin-dexamethasone (CIPRODEX) OTIC suspension Place 4 drops into both ears 2 (two) times daily for 7 days. 11/30/23 12/07/23 Yes Eusebio Friendly B, PA-C  ibuprofen (ADVIL) 600 MG tablet Take 1 tablet (600 mg total) by mouth every 8 (eight) hours as needed. 10/29/23   Domenick Gong, MD  ipratropium (ATROVENT) 0.03 % nasal spray Place 2 sprays into both nostrils every 12 (twelve) hours. 08/17/20   Roxy Horseman, PA-C  ondansetron (ZOFRAN) 4 MG tablet Take 1 tablet (4 mg total) by mouth every 8 (eight) hours as needed for nausea or vomiting. 07/19/20   Junie Spencer, FNP    Family History History reviewed. No pertinent family history.  Social History Social History   Tobacco Use   Smoking status: Never   Smokeless tobacco: Never  Vaping Use   Vaping status: Never Used  Substance Use Topics   Alcohol use: Not Currently   Drug use: Not Currently     Allergies    Penicillins   Review of Systems Review of Systems  Constitutional:  Negative for fatigue and fever.  HENT:  Positive for ear pain and hearing loss. Negative for congestion, ear discharge, rhinorrhea, sinus pain and sore throat.   Respiratory:  Negative for cough.   Neurological:  Negative for dizziness and headaches.     Physical Exam Triage Vital Signs ED Triage Vitals  Encounter Vitals Group     BP 11/30/23 1513 112/72     Systolic BP Percentile --      Diastolic BP Percentile --      Pulse Rate 11/30/23 1513 75     Resp 11/30/23 1513 18     Temp 11/30/23 1513 98.2 F (36.8 C)     Temp Source 11/30/23 1513 Oral     SpO2 11/30/23 1513 98 %     Weight --      Height --      Head Circumference --      Peak Flow --      Pain Score 11/30/23 1511 5     Pain Loc --      Pain Education --      Exclude from Growth Chart --    No data found.  Updated Vital Signs BP 112/72 (BP Location: Left Arm)   Pulse 75   Temp 98.2 F (36.8 C) (Oral)   Resp 18   SpO2 98%       Physical Exam Vitals and nursing  note reviewed.  Constitutional:      General: He is not in acute distress.    Appearance: Normal appearance. He is well-developed. He is not ill-appearing.  HENT:     Head: Normocephalic and atraumatic.     Right Ear: External ear normal. There is impacted cerumen.     Left Ear: External ear normal. There is impacted cerumen.  Eyes:     Conjunctiva/sclera: Conjunctivae normal.  Cardiovascular:     Rate and Rhythm: Normal rate.  Pulmonary:     Effort: Pulmonary effort is normal. No respiratory distress.  Musculoskeletal:     Cervical back: Neck supple.  Skin:    General: Skin is warm and dry.     Capillary Refill: Capillary refill takes less than 2 seconds.  Neurological:     General: No focal deficit present.     Mental Status: He is alert. Mental status is at baseline.     Motor: No weakness.  Psychiatric:        Mood and Affect: Mood normal.      UC  Treatments / Results  Labs (all labs ordered are listed, but only abnormal results are displayed) Labs Reviewed - No data to display  EKG   Radiology No results found.  Procedures Procedures (including critical care time)  Medications Ordered in UC Medications - No data to display  Initial Impression / Assessment and Plan / UC Course  I have reviewed the triage vital signs and the nursing notes.  Pertinent labs & imaging results that were available during my care of the patient were reviewed by me and considered in my medical decision making (see chart for details).   33 year old male presents with bilateral ear fullness, pressure and reduced hearing for the past couple days.  Vitals are stable and normal and he is overall well-appearing.  No acute distress.  On exam has bilateral cerumen impaction.  Will have nursing staff perform otic lavage and reassess.  Reexamination shows erythema and swelling of bilateral EACs.  Patient reports discomfort.  Will cover for otitis externa at this time with Ciprodex.  Supportive care advised with Tylenol Motrin, warm compresses.  Reviewed return precautions.   Final Clinical Impressions(s) / UC Diagnoses   Final diagnoses:  Bilateral impacted cerumen  Acute otitis externa of both ears, unspecified type     Discharge Instructions      -Take motrin/Tylenol for pain and use ear drops -Return if worsening symptoms or not improving in a few days     ED Prescriptions     Medication Sig Dispense Auth. Provider   ciprofloxacin-dexamethasone (CIPRODEX) OTIC suspension Place 4 drops into both ears 2 (two) times daily for 7 days. 7.5 mL Shirlee Latch, PA-C      PDMP not reviewed this encounter.   Shirlee Latch, PA-C 11/30/23 [phone]

## 2023-11-30 NOTE — Discharge Instructions (Addendum)
-  Take motrin/Tylenol for pain and use ear drops -Return if worsening symptoms or not improving in a few days

## 2024-01-18 ENCOUNTER — Ambulatory Visit
Admission: EM | Admit: 2024-01-18 | Discharge: 2024-01-18 | Disposition: A | Discharge status: Home / Self Care | Payer: Self-pay | Attending: Family Medicine | Admitting: Family Medicine

## 2024-01-18 DIAGNOSIS — J069 Acute upper respiratory infection, unspecified: Secondary | ICD-10-CM
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
## 2024-01-18 NOTE — Discharge Instructions (Signed)
 Gita Lamb', you have a viral respiratory infection that will gradually improve over the next 7-10 days. Cough may last up to 3 weeks.    You can take Tylenol  and/or Ibuprofen  as needed for fever reduction and pain relief.    For cough: honey 1/2 to 1 teaspoon (you can dilute the honey in water or another fluid). You can also use guaifenesin and dextromethorphan for cough. You can use a humidifier for chest congestion and cough.  If you don't have a humidifier, you can sit in the bathroom with the hot shower running.      For sore throat: try warm salt water gargles, Mucinex sore throat cough drops or cepacol lozenges, throat spray, warm tea or water with lemon/honey, popsicles or ice, or OTC cold relief medicine for throat discomfort. You can also purchase chloraseptic spray at the pharmacy or dollar store.   For congestion: take a daily anti-histamine like Zyrtec, Claritin, and a oral decongestant, such as pseudoephedrine.  You can also use Flonase 1-2 sprays in each nostril daily. Afrin is also a good option, if you do not have high blood pressure.    It is important to stay hydrated: drink plenty of fluids (water, gatorade/powerade/pedialyte, juices, or teas) to keep your throat moisturized and help further relieve irritation/discomfort.    Return or go to the Emergency Department if symptoms worsen or do not improve in the next few days
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
## 2024-06-08 ENCOUNTER — Encounter: Payer: Self-pay | Admitting: Emergency Medicine

## 2024-06-08 ENCOUNTER — Ambulatory Visit
Admission: EM | Admit: 2024-06-08 | Discharge: 2024-06-08 | Disposition: A | Discharge status: Home / Self Care | Attending: Physician Assistant | Admitting: Physician Assistant

## 2024-06-08 DIAGNOSIS — R110 Nausea: Secondary | ICD-10-CM | POA: Diagnosis present

## 2024-06-08 LAB — RESP PANEL BY RT-PCR (FLU A&B, COVID) ARPGX2
Influenza A by PCR: NEGATIVE
Influenza B by PCR: NEGATIVE
SARS Coronavirus 2 by RT PCR: NEGATIVE

## 2024-06-08 MED ORDER — ONDANSETRON 4 MG PO TBDP
4.0000 mg | ORAL_TABLET | Freq: Once | ORAL | Status: AC
  Administered 2024-06-08: 4 mg via ORAL

## 2024-06-08 MED ORDER — ONDANSETRON HCL 4 MG PO TABS: 4.0000 mg | ORAL_TABLET | Freq: Four times a day (QID) | ORAL | 0 refills | Status: AC

## 2024-06-08 NOTE — Discharge Instructions (Addendum)
 Your testing today was negative for COVID and influenza.  The cause of your chronic nausea is unclear.  You do need to be evaluated by gastroenterology and I encourage you to establish care with a primary care provider for ongoing evaluation and management to further workup why you are experiencing chronic nausea.  It may also be helpful to see if your employer has an employee assistance program as if this is coming from stress and anxiety they may be able to provide you with free access to counseling services which could help you with your symptoms.  Use the Zofran  every 6 hours as needed for nausea.  If you develop any vomiting that does not respond to the Zofran , cannot keep down food or fluids, develop diarrhea, blood in your stool, abdominal pain, fever, or fainting you need to go to the ER for evaluation.

## 2024-06-08 NOTE — ED Provider Notes (Signed)
 [facility]    CSN: [id number] Arrival date & time: 06/08/24  1039      History   Chief Complaint Chief Complaint  Patient presents with   Nausea    HPI Eric Valdez is a 33 y.o. male.   HPI  33 year old male with past medical history significant for asthma, eczema, and nausea presents for evaluation of worsening nausea over the last 3 days.  He believes it is may be due to spice stress.  He does not currently have a PCP and he has never been evaluated by GI for his chronic nausea.  He cannot identify any other precipitating triggers other than stress.  No associated vomiting, diarrhea, abdominal pain, or fever.  No respiratory symptoms.  History reviewed. No pertinent past medical history.  Patient Active Problem List   Diagnosis Date Noted   Status post laparoscopic appendectomy 05/14/2020    Past Surgical History:  Procedure Laterality Date   APPENDECTOMY     XI ROBOTIC LAPAROSCOPIC ASSISTED APPENDECTOMY N/A 05/07/2020   Procedure: XI ROBOTIC LAPAROSCOPIC ASSISTED APPENDECTOMY;  Surgeon: Lane Shope, MD;  Location: [facility] ORS;  Service: General;  Laterality: N/A;       Home Medications    Prior to Admission medications   Medication Sig Start Date End Date Taking? Authorizing Provider  ondansetron  (ZOFRAN ) 4 MG tablet Take 1 tablet (4 mg total) by mouth every 6 (six) hours. 06/08/24  Yes Bernardino Ditch, NP  ibuprofen  (ADVIL ) 600 MG tablet Take 1 tablet (600 mg total) by mouth every 8 (eight) hours as needed. 10/29/23   Van Knee, MD    Family History History reviewed. No pertinent family history.  Social History Social History   Tobacco Use   Smoking status: Never   Smokeless tobacco: Never  Vaping Use   Vaping status: Never Used  Substance Use Topics   Alcohol use: Not Currently   Drug use: Not Currently     Allergies   Penicillins   Review of Systems Review of Systems  Constitutional:  Negative for fever.  HENT:  Negative  for congestion, ear pain and rhinorrhea.   Respiratory:  Negative for cough.   Gastrointestinal:  Positive for nausea. Negative for abdominal pain, diarrhea and vomiting.     Physical Exam Triage Vital Signs ED Triage Vitals  Encounter Vitals Group     BP 06/08/24 1057 110/74     Girls Systolic BP Percentile --      Girls Diastolic BP Percentile --      Boys Systolic BP Percentile --      Boys Diastolic BP Percentile --      Pulse Rate 06/08/24 1057 85     Resp 06/08/24 1057 15     Temp 06/08/24 1057 97.7 F (36.5 C)     Temp Source 06/08/24 1057 Temporal     SpO2 06/08/24 1057 97 %     Weight 06/08/24 1055 169 lb 15.6 oz (77.1 kg)     Height 06/08/24 1055 5' 11 (1.803 m)     Head Circumference --      Peak Flow --      Pain Score 06/08/24 1055 0     Pain Loc --      Pain Education --      Exclude from Growth Chart --    No data found.  Updated Vital Signs BP 110/74 (BP Location: Left Arm)   Pulse 85   Temp 97.7 F (36.5 C) (Temporal) Comment: patient drinking soda  at this time  Resp 15   Ht 5' 11 (1.803 m)   Wt 169 lb 15.6 oz (77.1 kg)   SpO2 97%   BMI 23.71 kg/m   Visual Acuity Right Eye Distance:   Left Eye Distance:   Bilateral Distance:    Right Eye Near:   Left Eye Near:    Bilateral Near:     Physical Exam Vitals and nursing note reviewed.  Constitutional:      Appearance: Normal appearance. He is not ill-appearing.  HENT:     Head: Normocephalic and atraumatic.  Abdominal:     General: Abdomen is flat.     Palpations: Abdomen is soft.     Tenderness: There is no abdominal tenderness. There is no guarding or rebound.  Skin:    General: Skin is warm and dry.     Capillary Refill: Capillary refill takes less than 2 seconds.     Findings: No rash.  Neurological:     General: No focal deficit present.     Mental Status: He is alert and oriented to person, place, and time.      UC Treatments / Results  Labs (all labs ordered are listed,  but only abnormal results are displayed) Labs Reviewed  RESP PANEL BY RT-PCR (FLU A&B, COVID) [id number]    EKG   Radiology No results found.  Procedures Procedures (including critical care time)  Medications Ordered in UC Medications  ondansetron  (ZOFRAN -ODT) disintegrating tablet 4 mg (4 mg Oral Given 06/08/24 1119)    Initial Impression / Assessment and Plan / UC Course  I have reviewed the triage vital signs and the nursing notes.  Pertinent labs & imaging results that were available during my care of the patient were reviewed by me and considered in my medical decision making (see chart for details).   Patient is a nontoxic-appearing 33 year old male presenting for evaluation of worsening nausea over the last 3 days.  He reports that he has chronic nausea at baseline which has never been evaluated.  He does have a past medical history of asthma but reports he has not needed his inhaler in the last several years.  His scalp does appear to have some scaly patches in different areas that resembles eczema.  The patient has difficulty making eye contact and reports that he has significant anxiety as well.  He works a job at [employer] reports that he had to call out yesterday due to the nausea interfering with his sleep.  I asked him if he had ever noticed that any foods worsen his nausea.  He reports that as a child he was intolerant of milk but as an adult he has not had any difficulty with dairy products or gluten.  He does eat spicy food but not in excess.  He cannot think of anything that precipitates nausea other than stress.  A respiratory panel to check for COVID or flu was collected at triage.  I suspect these may be secondary to anxiety.  He does not currently have a PCP and reports that he is not able to afford to go to the doctor at present.  I will have staff administer 4 mg of ODT Zofran  here in clinic to help the patient with his nausea.  Respiratory panel is negative for COVID and  influenza.  Will discharge patient with diagnosis of chronic nausea and will prescribe Zofran  4 mg tablets that he can take every 6 hours as needed for his nausea.  I  will encourage him to establish care with a PCP for further evaluation.  Return precautions reviewed.  Work note provided.   Final Clinical Impressions(s) / UC Diagnoses   Final diagnoses:  Chronic nausea     Discharge Instructions      Your testing today was negative for COVID and influenza.  The cause of your chronic nausea is unclear.  You do need to be evaluated by gastroenterology and I encourage you to establish care with a primary care provider for ongoing evaluation and management to further workup why you are experiencing chronic nausea.  It may also be helpful to see if your employer has an employee assistance program as if this is coming from stress and anxiety they may be able to provide you with free access to counseling services which could help you with your symptoms.  Use the Zofran  every 6 hours as needed for nausea.  If you develop any vomiting that does not respond to the Zofran , cannot keep down food or fluids, develop diarrhea, blood in your stool, abdominal pain, fever, or fainting you need to go to the ER for evaluation.     ED Prescriptions     Medication Sig Dispense Auth. Provider   ondansetron  (ZOFRAN ) 4 MG tablet Take 1 tablet (4 mg total) by mouth every 6 (six) hours. 12 tablet Bernardino Ditch, NP      PDMP not reviewed this encounter.   Bernardino Ditch, NP 06/08/24 1154

## 2024-06-08 NOTE — ED Triage Notes (Addendum)
 Patient states that he has chronic nausea.  Patient states that he has had worsening nausea and not feeling good for the past 3 days. Patient also reports some burping but is currently drinking soda during triage.  Patient denies vomiting or diarrhea.  Patient unsure of fevers.  Patient will need a work note.
